# Patient Record
Sex: Female | Born: 1975 | Race: White | Hispanic: No | Marital: Married | Smoking: Former smoker
Health system: Southern US, Community
[De-identification: ages and names within clinical notes are randomized; demographics above are authoritative.]

## PROBLEM LIST (undated history)

## (undated) DIAGNOSIS — Z9289 Personal history of other medical treatment: Secondary | ICD-10-CM

## (undated) DIAGNOSIS — R7989 Other specified abnormal findings of blood chemistry: Secondary | ICD-10-CM

## (undated) DIAGNOSIS — M1990 Unspecified osteoarthritis, unspecified site: Secondary | ICD-10-CM

## (undated) DIAGNOSIS — IMO0001 Reserved for inherently not codable concepts without codable children: Secondary | ICD-10-CM

## (undated) DIAGNOSIS — I219 Acute myocardial infarction, unspecified: Secondary | ICD-10-CM

## (undated) DIAGNOSIS — J449 Chronic obstructive pulmonary disease, unspecified: Secondary | ICD-10-CM

## (undated) DIAGNOSIS — I509 Heart failure, unspecified: Secondary | ICD-10-CM

## (undated) DIAGNOSIS — R011 Cardiac murmur, unspecified: Secondary | ICD-10-CM

## (undated) DIAGNOSIS — E785 Hyperlipidemia, unspecified: Secondary | ICD-10-CM

## (undated) DIAGNOSIS — I639 Cerebral infarction, unspecified: Secondary | ICD-10-CM

## (undated) DIAGNOSIS — G43909 Migraine, unspecified, not intractable, without status migrainosus: Secondary | ICD-10-CM

## (undated) DIAGNOSIS — I10 Essential (primary) hypertension: Secondary | ICD-10-CM

## (undated) HISTORY — DX: Migraine, unspecified, not intractable, without status migrainosus: G43.909

## (undated) HISTORY — DX: Unspecified osteoarthritis, unspecified site: M19.90

## (undated) HISTORY — DX: Other specified abnormal findings of blood chemistry: R79.89

## (undated) HISTORY — DX: Morbid (severe) obesity due to excess calories: E66.01

## (undated) HISTORY — DX: Personal history of other medical treatment: Z92.89

## (undated) HISTORY — DX: Reserved for inherently not codable concepts without codable children: IMO0001

## (undated) HISTORY — DX: Essential (primary) hypertension: I10

## (undated) HISTORY — DX: Hyperlipidemia, unspecified: E78.5

---

## 2001-01-17 DIAGNOSIS — I639 Cerebral infarction, unspecified: Secondary | ICD-10-CM | POA: Insufficient documentation

## 2001-01-17 HISTORY — DX: Cerebral infarction, unspecified: I63.9

## 2003-12-15 ENCOUNTER — Emergency Department: Payer: Self-pay | Admitting: Emergency Medicine

## 2004-05-20 ENCOUNTER — Ambulatory Visit: Payer: Self-pay | Admitting: Unknown Physician Specialty

## 2005-02-03 ENCOUNTER — Ambulatory Visit: Payer: Self-pay | Admitting: Unknown Physician Specialty

## 2006-10-18 ENCOUNTER — Ambulatory Visit: Payer: Self-pay | Admitting: Family Medicine

## 2007-09-28 ENCOUNTER — Ambulatory Visit: Payer: Self-pay | Admitting: Obstetrics & Gynecology

## 2007-10-02 ENCOUNTER — Ambulatory Visit: Payer: Self-pay | Admitting: Obstetrics & Gynecology

## 2007-11-22 ENCOUNTER — Emergency Department: Payer: Self-pay | Admitting: Emergency Medicine

## 2008-03-13 ENCOUNTER — Emergency Department: Payer: Self-pay | Admitting: Emergency Medicine

## 2008-06-26 ENCOUNTER — Ambulatory Visit: Payer: Self-pay | Admitting: Family Medicine

## 2008-08-01 ENCOUNTER — Ambulatory Visit: Payer: Self-pay | Admitting: Family Medicine

## 2008-08-12 ENCOUNTER — Ambulatory Visit: Payer: Self-pay | Admitting: Family Medicine

## 2008-09-01 ENCOUNTER — Ambulatory Visit: Payer: Self-pay | Admitting: Family Medicine

## 2008-09-17 ENCOUNTER — Ambulatory Visit: Payer: Self-pay | Admitting: Family Medicine

## 2010-10-23 ENCOUNTER — Emergency Department: Payer: Self-pay | Admitting: Emergency Medicine

## 2010-11-17 ENCOUNTER — Emergency Department: Payer: Self-pay | Admitting: Emergency Medicine

## 2011-03-19 ENCOUNTER — Emergency Department: Payer: Self-pay | Admitting: *Deleted

## 2011-04-30 ENCOUNTER — Emergency Department: Payer: Self-pay | Admitting: Emergency Medicine

## 2011-04-30 LAB — CBC
HCT: 44.3 % (ref 35.0–47.0)
MCH: 30.7 pg (ref 26.0–34.0)
MCHC: 34.1 g/dL (ref 32.0–36.0)
Platelet: 164 10*3/uL (ref 150–440)
WBC: 7.3 10*3/uL (ref 3.6–11.0)

## 2011-04-30 LAB — URINALYSIS, COMPLETE
Hyaline Cast: 4
Nitrite: POSITIVE
Protein: NEGATIVE
RBC,UR: 4 /HPF (ref 0–5)
Specific Gravity: 1.032 (ref 1.003–1.030)

## 2011-04-30 LAB — COMPREHENSIVE METABOLIC PANEL
Albumin: 3.6 g/dL (ref 3.4–5.0)
BUN: 14 mg/dL (ref 7–18)
Bilirubin,Total: 0.4 mg/dL (ref 0.2–1.0)
Calcium, Total: 8.6 mg/dL (ref 8.5–10.1)
Co2: 25 mmol/L (ref 21–32)
Creatinine: 0.87 mg/dL (ref 0.60–1.30)
Glucose: 90 mg/dL (ref 65–99)
Osmolality: 291 (ref 275–301)
SGPT (ALT): 54 U/L
Sodium: 146 mmol/L — ABNORMAL HIGH (ref 136–145)

## 2011-04-30 LAB — LIPASE, BLOOD: Lipase: 123 U/L (ref 73–393)

## 2011-09-07 ENCOUNTER — Emergency Department: Payer: Self-pay | Admitting: *Deleted

## 2011-09-07 LAB — CBC
HCT: 46.7 % (ref 35.0–47.0)
HGB: 15.9 g/dL (ref 12.0–16.0)
Platelet: 167 10*3/uL (ref 150–440)
WBC: 6.9 10*3/uL (ref 3.6–11.0)

## 2011-09-07 LAB — BASIC METABOLIC PANEL
Anion Gap: 8 (ref 7–16)
BUN: 20 mg/dL — ABNORMAL HIGH (ref 7–18)
Creatinine: 0.86 mg/dL (ref 0.60–1.30)
EGFR (African American): >60
EGFR (Non-African Amer.): >60

## 2011-09-07 LAB — CK TOTAL AND CKMB (NOT AT ARMC): CK-MB: 1.6 ng/mL (ref 0.5–3.6)

## 2011-09-08 LAB — TROPONIN I: Troponin-I: <0.02 ng/mL

## 2011-09-12 ENCOUNTER — Encounter (HOSPITAL_COMMUNITY): Payer: Self-pay | Admitting: Emergency Medicine

## 2011-09-12 ENCOUNTER — Emergency Department (HOSPITAL_COMMUNITY): Payer: Self-pay

## 2011-09-12 ENCOUNTER — Emergency Department (HOSPITAL_COMMUNITY)
Admission: EM | Admit: 2011-09-12 | Discharge: 2011-09-13 | Disposition: A | Discharge status: Home / Self Care | Payer: Self-pay | Attending: Emergency Medicine | Admitting: Emergency Medicine

## 2011-09-12 DIAGNOSIS — J40 Bronchitis, not specified as acute or chronic: Secondary | ICD-10-CM | POA: Insufficient documentation

## 2011-09-12 DIAGNOSIS — R079 Chest pain, unspecified: Secondary | ICD-10-CM | POA: Insufficient documentation

## 2011-09-12 DIAGNOSIS — R0682 Tachypnea, not elsewhere classified: Secondary | ICD-10-CM | POA: Insufficient documentation

## 2011-09-12 DIAGNOSIS — R0602 Shortness of breath: Secondary | ICD-10-CM | POA: Insufficient documentation

## 2011-09-12 HISTORY — DX: Acute myocardial infarction, unspecified: I21.9

## 2011-09-12 HISTORY — DX: Cardiac murmur, unspecified: R01.1

## 2011-09-12 HISTORY — DX: Cerebral infarction, unspecified: I63.9

## 2011-09-12 LAB — COMPREHENSIVE METABOLIC PANEL
ALT: 37 U/L — ABNORMAL HIGH (ref 0–35)
Alkaline Phosphatase: 97 U/L (ref 39–117)
BUN: 24 mg/dL — ABNORMAL HIGH (ref 6–23)
CO2: 17 mEq/L — ABNORMAL LOW (ref 19–32)
Chloride: 109 mEq/L (ref 96–112)
GFR calc Af Amer: >90 mL/min (ref >90)
GFR calc non Af Amer: >90 mL/min (ref >90)
Glucose, Bld: 90 mg/dL (ref 70–99)
Potassium: 3.7 mEq/L (ref 3.5–5.1)
Sodium: 141 mEq/L (ref 135–145)
Total Bilirubin: 0.2 mg/dL — ABNORMAL LOW (ref 0.3–1.2)
Total Protein: 7 g/dL (ref 6.0–8.3)

## 2011-09-12 LAB — CBC WITH DIFFERENTIAL/PLATELET
Basophils Absolute: 0 10*3/uL (ref 0.0–0.1)
Basophils Relative: 0 % (ref 0–1)
Lymphocytes Relative: 34 % (ref 12–46)
MCHC: 34.7 g/dL (ref 30.0–36.0)
Neutro Abs: 4.4 10*3/uL (ref 1.7–7.7)
Neutrophils Relative %: 53 % (ref 43–77)
Platelets: 196 10*3/uL (ref 150–400)
RDW: 14.2 % (ref 11.5–15.5)
WBC: 8.2 10*3/uL (ref 4.0–10.5)

## 2011-09-12 LAB — POCT I-STAT TROPONIN I: Troponin i, poc: 0 ng/mL (ref 0.00–0.08)

## 2011-09-12 MED ORDER — IPRATROPIUM BROMIDE 0.02 % IN SOLN
0.5000 mg | Freq: Once | RESPIRATORY_TRACT | Status: AC
  Administered 2011-09-13: 0.5 mg via RESPIRATORY_TRACT
  Filled 2011-09-12: qty 2.5

## 2011-09-12 MED ORDER — ALBUTEROL SULFATE (5 MG/ML) 0.5% IN NEBU
5.0000 mg | INHALATION_SOLUTION | Freq: Once | RESPIRATORY_TRACT | Status: AC
  Administered 2011-09-13: 5 mg via RESPIRATORY_TRACT
  Filled 2011-09-12: qty 1

## 2011-09-12 NOTE — ED Notes (Signed)
Pt reports mid-sternum chest pain/pressure, bilateral hand numbness, SOB that increases w/exertion x6 days, pt describes her pain as a stabbing pain. Pt states "it feels like someone is stabbing me w/a knife and rotating it." pt's sats 100% ra. Pt denies dizziness, N/V/D, or abd/back pain. Pt sen at [facility] ED for the same on Wednesday and dx a "heart murmur."

## 2011-09-12 NOTE — ED Notes (Addendum)
SOB; chest pain starting Wednesday; stabbing and radiating down left arm; worse with inspiration; worse with lying down flat. Feels lightheaded and numbness and fingers when walking a lot. Breathe sounds clear.

## 2011-09-12 NOTE — ED Notes (Signed)
MD at bedside. EDPA Peter 

## 2011-09-12 NOTE — ED Provider Notes (Signed)
History     CSN: [id number]  Arrival date & time 09/12/11  2051   First MD Initiated Contact with Patient 09/12/11 2341      Chief Complaint  Patient presents with  . Chest Pain   HPI  History provided by the patient. Patient is a 36 year old female who reports a history of stroke in 2003 as well as a history of MI in 2008 who presents with complaints of intermittent chest pain and discomfort with shortness of breath for the past several days. Patient states that symptoms began last Wednesday 5 days ago. She reports having shortness of breath symptoms with chest pressure and sharp pains. Pain is worse with activity or with lying flat. Symptoms improve sitting upright and resting comfortably. Patient also reports sharp pleuritic pains with deep breathing or cough. slightly increased nonproductive cough. Denies hemoptysis. Shortness of breath symptoms also produce increased breathing and perioral numbness as well as numbness and seemed to fingers. Patient reports that she went to [facility] for these symptoms on Wednesday during that workup she states she was told she had a heart murmur but states she became upset with the staff in left. She does state today did some tests including an x-ray but she was not told of having any concerning findings. Patient denies having similar symptoms previously. She denies any recent travel, no prior history of DVT or PE, no history of cancer, no history of admission birth control use.     Past Medical History  Diagnosis Date  . Stroke   . MI (myocardial infarction)     2008  . Murmur     No past surgical history on file.  No family history on file.  History  Substance Use Topics  . Smoking status: Current Everyday Smoker -- 1.0 packs/day  . Smokeless tobacco: Not on file  . Alcohol Use:     OB History    Grav Para Term Preterm Abortions TAB SAB Ect Mult Living                  Review of Systems  Constitutional: Negative for fever  and chills.  Respiratory: Positive for cough and shortness of breath.   Cardiovascular: Positive for chest pain. Negative for palpitations.  Gastrointestinal: Negative for nausea, vomiting, diarrhea and constipation.  Genitourinary: Positive for decreased urine volume. Negative for dysuria, frequency, hematuria and flank pain.  Skin: Negative for rash.  Neurological: Positive for light-headedness and numbness. Negative for weakness.    Allergies  Aspirin and Penicillins  Home Medications  No current outpatient prescriptions on file.  BP 105/67  Pulse 86  Temp 97.8 F (36.6 C) (Oral)  Resp 27  SpO2 100%  Physical Exam  Nursing note and vitals reviewed. Constitutional: She is oriented to person, place, and time. She appears well-developed and well-nourished. No distress.  HENT:  Head: Normocephalic and atraumatic.  Cardiovascular: Normal rate and regular rhythm.   No murmur heard. Pulmonary/Chest: Breath sounds normal. Tachypnea noted. No respiratory distress. She has no wheezes. She has no rales. She exhibits no tenderness.  Abdominal: Soft. There is no tenderness. There is no rebound and no guarding.  Musculoskeletal: Normal range of motion. She exhibits no edema and no tenderness.       No significant swelling of lower extremities. No tenderness of her calves. Skin normal color and temperature.  Neurological: She is alert and oriented to person, place, and time.  Skin: Skin is warm and dry. No rash noted.  Psychiatric:  She has a normal mood and affect. Her behavior is normal.    ED Course  Procedures    Results for orders placed during the hospital encounter of 09/12/11  COMPREHENSIVE METABOLIC PANEL      Component Value Range   Sodium 141  135 - 145 mEq/L   Potassium 3.7  3.5 - 5.1 mEq/L   Chloride 109  96 - 112 mEq/L   CO2 17 (*) 19 - 32 mEq/L   Glucose, Bld 90  70 - 99 mg/dL   BUN 24 (*) 6 - 23 mg/dL   Creatinine, Ser 1.61  0.50 - 1.10 mg/dL   Calcium 9.6  8.4  - 09.6 mg/dL   Total Protein 7.0  6.0 - 8.3 g/dL   Albumin 3.8  3.5 - 5.2 g/dL   AST 29  0 - 37 U/L   ALT 37 (*) 0 - 35 U/L   Alkaline Phosphatase 97  39 - 117 U/L   Total Bilirubin 0.2 (*) 0.3 - 1.2 mg/dL   GFR calc non Af Amer >90  >90 mL/min   GFR calc Af Amer >90  >90 mL/min  CBC WITH DIFFERENTIAL      Component Value Range   WBC 8.2  4.0 - 10.5 K/uL   RBC 5.04  3.87 - 5.11 MIL/uL   Hemoglobin 15.3 (*) 12.0 - 15.0 g/dL   HCT 04.5  40.9 - 81.1 %   MCV 87.5  78.0 - 100.0 fL   MCH 30.4  26.0 - 34.0 pg   MCHC 34.7  30.0 - 36.0 g/dL   RDW 91.4  78.2 - 95.6 %   Platelets 196  150 - 400 K/uL   Neutrophils Relative 53  43 - 77 %   Neutro Abs 4.4  1.7 - 7.7 K/uL   Lymphocytes Relative 34  12 - 46 %   Lymphs Abs 2.8  0.7 - 4.0 K/uL   Monocytes Relative 10  3 - 12 %   Monocytes Absolute 0.8  0.1 - 1.0 K/uL   Eosinophils Relative 2  0 - 5 %   Eosinophils Absolute 0.2  0.0 - 0.7 K/uL   Basophils Relative 0  0 - 1 %   Basophils Absolute 0.0  0.0 - 0.1 K/uL  POCT I-STAT TROPONIN I      Component Value Range   Troponin i, poc 0.00  0.00 - 0.08 ng/mL   Comment 3           D-DIMER, QUANTITATIVE      Component Value Range   D-Dimer, Quant 0.34  0.00 - 0.48 ug/mL-FEU       Dg Chest 2 View  09/12/2011  *RADIOLOGY REPORT*  Clinical Data: Chest pain and shortness of breath.  CHEST - 2 VIEW  Comparison: None.  Findings: Normal sized heart.  Clear lungs.  Breathing motion blurring on the lateral view.  Minimal diffuse peribronchial thickening.  Mild thoracic spine degenerative changes.  IMPRESSION: Minimal bronchitic changes.   Original Report Authenticated By: Darrol Angel, M.D.      1. Bronchitis       MDM  11:40PM issues seen and evaluated. Patient does not appear in any acute distress does have slight tachypnea. Normal O2 sats, normal heart rate.  Patient has not had any recent travel, no recent surgery, no prior history of cancer, no prior history of DVT or PE, no history of  hemoptysis, no history of estrogen use. D-dimer normal.  Patient with symptoms of hyperventilation  perioral numbness tingling and numbness in fingers and lightheadedness.  Patient given breathing treatment and reports feeling much better. Patient now with normal respirations. She continues abnormal O2 sats and heart rate. Symptoms may be related to bronchitis that an x-ray and recent increase cough. Patient given albuterol inhaler to take home. She given strict return precautions.      Date: 09/13/2011  Rate: 96  Rhythm: normal sinus rhythm  QRS Axis: left  Intervals: normal  ST/T Wave abnormalities: normal  Conduction Disutrbances:none  Narrative Interpretation: Slight left axis deviation  Old EKG Reviewed: unchanged    Angus Seller, PA 09/13/11 0107

## 2011-09-12 NOTE — ED Notes (Signed)
Patient ambulatory without difficulties outside.  Patient states "I need to get some air."

## 2011-09-13 ENCOUNTER — Encounter (HOSPITAL_COMMUNITY): Payer: Self-pay

## 2011-09-13 LAB — D-DIMER, QUANTITATIVE: D-Dimer, Quant: 0.34 ug/mL-FEU (ref 0.00–0.48)

## 2011-09-13 MED ORDER — ALBUTEROL SULFATE HFA 108 (90 BASE) MCG/ACT IN AERS
2.0000 | INHALATION_SPRAY | RESPIRATORY_TRACT | Status: DC | PRN
  Administered 2011-09-13: 2 via RESPIRATORY_TRACT
  Filled 2011-09-13: qty 6.7

## 2011-09-13 NOTE — ED Provider Notes (Signed)
Medical screening examination/treatment/procedure(s) were performed by non-physician practitioner and as supervising physician I was immediately available for consultation/collaboration.  Sunnie Nielsen, MD 09/13/11 [phone]

## 2012-07-08 ENCOUNTER — Emergency Department: Payer: Self-pay | Admitting: Emergency Medicine

## 2012-07-08 LAB — URINALYSIS, COMPLETE
Bacteria: NONE SEEN
Bilirubin,UR: NEGATIVE
Glucose,UR: NEGATIVE mg/dL (ref 0–75)
Ketone: NEGATIVE
Ph: 5 (ref 4.5–8.0)
Protein: NEGATIVE
WBC UR: 3 /HPF (ref 0–5)

## 2012-07-08 LAB — BASIC METABOLIC PANEL
BUN: 17 mg/dL (ref 7–18)
Calcium, Total: 9.2 mg/dL (ref 8.5–10.1)
Chloride: 108 mmol/L — ABNORMAL HIGH (ref 98–107)
Co2: 27 mmol/L (ref 21–32)
Creatinine: 0.95 mg/dL (ref 0.60–1.30)
Glucose: 112 mg/dL — ABNORMAL HIGH (ref 65–99)
Potassium: 3.7 mmol/L (ref 3.5–5.1)

## 2012-07-08 LAB — CBC
MCH: 30.5 pg (ref 26.0–34.0)
MCHC: 34.1 g/dL (ref 32.0–36.0)
RDW: 14.3 % (ref 11.5–14.5)

## 2012-07-23 ENCOUNTER — Emergency Department: Payer: Self-pay | Admitting: Emergency Medicine

## 2012-09-24 ENCOUNTER — Emergency Department: Payer: Self-pay | Admitting: Emergency Medicine

## 2012-10-12 ENCOUNTER — Emergency Department: Payer: Self-pay | Admitting: Emergency Medicine

## 2012-10-12 LAB — CBC
MCH: 30.5 pg (ref 26.0–34.0)
MCHC: 34.3 g/dL (ref 32.0–36.0)
MCV: 89 fL (ref 80–100)
Platelet: 166 10*3/uL (ref 150–440)
RDW: 14.5 % (ref 11.5–14.5)
WBC: 6.4 10*3/uL (ref 3.6–11.0)

## 2012-10-13 LAB — COMPREHENSIVE METABOLIC PANEL
Albumin: 3.3 g/dL — ABNORMAL LOW (ref 3.4–5.0)
Anion Gap: 6 — ABNORMAL LOW (ref 7–16)
Co2: 23 mmol/L (ref 21–32)
Creatinine: 0.88 mg/dL (ref 0.60–1.30)
EGFR (African American): >60
Glucose: 150 mg/dL — ABNORMAL HIGH (ref 65–99)
Osmolality: 286 (ref 275–301)
Potassium: 3.6 mmol/L (ref 3.5–5.1)
SGOT(AST): 25 U/L (ref 15–37)
Sodium: 142 mmol/L (ref 136–145)

## 2012-12-05 ENCOUNTER — Emergency Department: Payer: Self-pay | Admitting: Internal Medicine

## 2012-12-20 ENCOUNTER — Emergency Department: Payer: Self-pay | Admitting: Internal Medicine

## 2012-12-20 LAB — URINALYSIS, COMPLETE
Ph: 5 (ref 4.5–8.0)
RBC,UR: 5 /HPF (ref 0–5)

## 2012-12-22 LAB — URINE CULTURE

## 2013-03-08 ENCOUNTER — Emergency Department: Payer: Self-pay | Admitting: Emergency Medicine

## 2013-03-21 ENCOUNTER — Emergency Department: Payer: Self-pay | Admitting: Emergency Medicine

## 2013-03-21 LAB — TROPONIN I

## 2013-03-21 LAB — URINALYSIS, COMPLETE
BILIRUBIN, UR: NEGATIVE
Glucose,UR: NEGATIVE mg/dL (ref 0–75)
Ketone: NEGATIVE
NITRITE: POSITIVE
Ph: 5 (ref 4.5–8.0)
Protein: NEGATIVE
SPECIFIC GRAVITY: 1.027 (ref 1.003–1.030)
Squamous Epithelial: 6
WBC UR: 21 /HPF (ref 0–5)

## 2013-03-21 LAB — CBC
HCT: 44.9 % (ref 35.0–47.0)
HGB: 14.7 g/dL (ref 12.0–16.0)
MCH: 29.5 pg (ref 26.0–34.0)
MCHC: 32.7 g/dL (ref 32.0–36.0)
MCV: 90 fL (ref 80–100)
Platelet: 190 10*3/uL (ref 150–440)
RBC: 4.98 10*6/uL (ref 3.80–5.20)
RDW: 14.5 % (ref 11.5–14.5)
WBC: 6.5 10*3/uL (ref 3.6–11.0)

## 2013-03-21 LAB — BASIC METABOLIC PANEL
Anion Gap: 6 — ABNORMAL LOW (ref 7–16)
BUN: 12 mg/dL (ref 7–18)
CHLORIDE: 110 mmol/L — AB (ref 98–107)
Calcium, Total: 9 mg/dL (ref 8.5–10.1)
Co2: 25 mmol/L (ref 21–32)
Creatinine: 0.87 mg/dL (ref 0.60–1.30)
EGFR (African American): >60
EGFR (Non-African Amer.): >60
GLUCOSE: 77 mg/dL (ref 65–99)
OSMOLALITY: 280 (ref 275–301)
Potassium: 3.4 mmol/L — ABNORMAL LOW (ref 3.5–5.1)
Sodium: 141 mmol/L (ref 136–145)

## 2013-03-21 LAB — PRO B NATRIURETIC PEPTIDE: B-TYPE NATIURETIC PEPTID: 91 pg/mL (ref 0–125)

## 2013-04-04 ENCOUNTER — Emergency Department: Payer: Self-pay | Admitting: Emergency Medicine

## 2013-07-24 ENCOUNTER — Emergency Department: Payer: Self-pay | Admitting: Emergency Medicine

## 2013-07-24 LAB — CBC
HCT: 50.2 % — ABNORMAL HIGH (ref 35.0–47.0)
HGB: 16 g/dL (ref 12.0–16.0)
MCH: 29 pg (ref 26.0–34.0)
MCHC: 31.8 g/dL — AB (ref 32.0–36.0)
MCV: 91 fL (ref 80–100)
PLATELETS: 186 10*3/uL (ref 150–440)
RBC: 5.52 10*6/uL — AB (ref 3.80–5.20)
RDW: 14.7 % — AB (ref 11.5–14.5)
WBC: 6.6 10*3/uL (ref 3.6–11.0)

## 2013-07-24 LAB — URINALYSIS, COMPLETE
BILIRUBIN, UR: NEGATIVE
GLUCOSE, UR: NEGATIVE mg/dL (ref 0–75)
KETONE: NEGATIVE
NITRITE: POSITIVE
Ph: 6 (ref 4.5–8.0)
SPECIFIC GRAVITY: 1.027 (ref 1.003–1.030)
Squamous Epithelial: 105
WBC UR: 59 /HPF (ref 0–5)

## 2013-07-24 LAB — COMPREHENSIVE METABOLIC PANEL
ALBUMIN: 3.8 g/dL (ref 3.4–5.0)
ALK PHOS: 77 U/L
ALT: 47 U/L (ref 12–78)
ANION GAP: 7 (ref 7–16)
BILIRUBIN TOTAL: 0.4 mg/dL (ref 0.2–1.0)
BUN: 14 mg/dL (ref 7–18)
CREATININE: 0.64 mg/dL (ref 0.60–1.30)
Calcium, Total: 8.7 mg/dL (ref 8.5–10.1)
Chloride: 109 mmol/L — ABNORMAL HIGH (ref 98–107)
Co2: 27 mmol/L (ref 21–32)
GLUCOSE: 88 mg/dL (ref 65–99)
OSMOLALITY: 285 (ref 275–301)
POTASSIUM: 3.7 mmol/L (ref 3.5–5.1)
SGOT(AST): 33 U/L (ref 15–37)
Sodium: 143 mmol/L (ref 136–145)
Total Protein: 7.5 g/dL (ref 6.4–8.2)

## 2013-07-24 LAB — TROPONIN I: Troponin-I: <0.02 ng/mL

## 2013-07-24 LAB — LIPASE, BLOOD: LIPASE: 175 U/L (ref 73–393)

## 2014-01-30 ENCOUNTER — Emergency Department: Payer: Self-pay | Admitting: Emergency Medicine

## 2014-02-05 DIAGNOSIS — M5417 Radiculopathy, lumbosacral region: Secondary | ICD-10-CM | POA: Insufficient documentation

## 2014-02-05 DIAGNOSIS — IMO0002 Reserved for concepts with insufficient information to code with codable children: Secondary | ICD-10-CM | POA: Insufficient documentation

## 2014-02-13 ENCOUNTER — Ambulatory Visit: Payer: Self-pay | Admitting: Surgery

## 2014-03-10 ENCOUNTER — Emergency Department: Payer: Self-pay | Admitting: Emergency Medicine

## 2014-03-26 ENCOUNTER — Ambulatory Visit: Payer: Self-pay | Admitting: Family Medicine

## 2014-04-04 ENCOUNTER — Ambulatory Visit: Payer: Self-pay | Admitting: Family Medicine

## 2014-12-08 ENCOUNTER — Emergency Department: Payer: 59

## 2014-12-08 ENCOUNTER — Encounter: Payer: Self-pay | Admitting: Medical Oncology

## 2014-12-08 ENCOUNTER — Emergency Department
Admission: EM | Admit: 2014-12-08 | Discharge: 2014-12-08 | Disposition: A | Discharge status: Home / Self Care | Payer: 59 | Attending: Emergency Medicine | Admitting: Emergency Medicine

## 2014-12-08 DIAGNOSIS — F17200 Nicotine dependence, unspecified, uncomplicated: Secondary | ICD-10-CM | POA: Diagnosis not present

## 2014-12-08 DIAGNOSIS — R109 Unspecified abdominal pain: Secondary | ICD-10-CM | POA: Diagnosis present

## 2014-12-08 DIAGNOSIS — R319 Hematuria, unspecified: Secondary | ICD-10-CM | POA: Diagnosis not present

## 2014-12-08 DIAGNOSIS — Z880 Allergy status to penicillin: Secondary | ICD-10-CM | POA: Diagnosis not present

## 2014-12-08 LAB — BASIC METABOLIC PANEL
Anion gap: 7 (ref 5–15)
BUN: 13 mg/dL (ref 6–20)
CHLORIDE: 105 mmol/L (ref 101–111)
CO2: 23 mmol/L (ref 22–32)
Calcium: 8.7 mg/dL — ABNORMAL LOW (ref 8.9–10.3)
Creatinine, Ser: 0.81 mg/dL (ref 0.44–1.00)
GFR calc non Af Amer: >60 mL/min (ref >60)
GLUCOSE: 95 mg/dL (ref 65–99)
POTASSIUM: 3.3 mmol/L — AB (ref 3.5–5.1)
Sodium: 135 mmol/L (ref 135–145)

## 2014-12-08 LAB — URINALYSIS COMPLETE WITH MICROSCOPIC (ARMC ONLY)
Bilirubin Urine: NEGATIVE
Glucose, UA: NEGATIVE mg/dL
Ketones, ur: NEGATIVE mg/dL
Leukocytes, UA: NEGATIVE
Nitrite: NEGATIVE
PROTEIN: NEGATIVE mg/dL
SPECIFIC GRAVITY, URINE: 1.018 (ref 1.005–1.030)
pH: 5 (ref 5.0–8.0)

## 2014-12-08 LAB — CBC
HEMATOCRIT: 48 % — AB (ref 35.0–47.0)
HEMOGLOBIN: 15.9 g/dL (ref 12.0–16.0)
MCH: 29.5 pg (ref 26.0–34.0)
MCHC: 33.1 g/dL (ref 32.0–36.0)
MCV: 89.2 fL (ref 80.0–100.0)
Platelets: 177 10*3/uL (ref 150–440)
RBC: 5.39 MIL/uL — ABNORMAL HIGH (ref 3.80–5.20)
RDW: 14.5 % (ref 11.5–14.5)
WBC: 8.2 10*3/uL (ref 3.6–11.0)

## 2014-12-08 MED ORDER — PHENAZOPYRIDINE HCL 200 MG PO TABS: 200.0000 mg | ORAL_TABLET | Freq: Three times a day (TID) | ORAL | Status: AC | PRN

## 2014-12-08 NOTE — Discharge Instructions (Signed)
Please seek medical attention for any high fevers, chest pain, shortness of breath, change in behavior, persistent vomiting, bloody stool or any other new or concerning symptoms. ° ° °Flank Pain °Flank pain refers to pain that is located on the side of the body between the upper abdomen and the back. The pain may occur over a short period of time (acute) or may be long-term or reoccurring (chronic). It may be mild or severe. Flank pain can be caused by many things. °CAUSES  °Some of the more common causes of flank pain include: °· Muscle strains.   °· Muscle spasms.   °· A disease of your spine (vertebral disk disease).   °· A lung infection (pneumonia).   °· Fluid around your lungs (pulmonary edema).   °· A kidney infection.   °· Kidney stones.   °· A very painful skin rash caused by the chickenpox virus (shingles).   °· Gallbladder disease.   °HOME CARE INSTRUCTIONS  °Home care will depend on the cause of your pain. In general, °· Rest as directed by your caregiver. °· Drink enough fluids to keep your urine clear or pale yellow. °· Only take over-the-counter or prescription medicines as directed by your caregiver. Some medicines may help relieve the pain. °· Tell your caregiver about any changes in your pain. °· Follow up with your caregiver as directed. °SEEK IMMEDIATE MEDICAL CARE IF:  °· Your pain is not controlled with medicine.   °· You have new or worsening symptoms. °· Your pain increases.   °· You have abdominal pain.   °· You have shortness of breath.   °· You have persistent nausea or vomiting.   °· You have swelling in your abdomen.   °· You feel faint or pass out.   °· You have blood in your urine. °· You have a fever or persistent symptoms for more than 2-3 days. °· You have a fever and your symptoms suddenly get worse. °MAKE SURE YOU:  °· Understand these instructions. °· Will watch your condition. °· Will get help right away if you are not doing well or get worse. °  °This information is not  intended to replace advice given to you by your health care provider. Make sure you discuss any questions you have with your health care provider. °  °Document Released: 02/24/2005 Document Revised: 09/28/2011 Document Reviewed: 08/18/2011 °Elsevier Interactive Patient Education ©2016 Elsevier Inc. ° °

## 2014-12-08 NOTE — ED Notes (Signed)
Pt reports bilt flank pain and blood in her urine since 0300 this am.

## 2014-12-08 NOTE — ED Notes (Signed)
Pt reports that she has had many urinary tract infections since she had her bladder "tacked" a few years ago. She states that this feels different and that her pain is higher in her flank area, mostly on the right side. Pt denies urinary frequency, burning upon urination, fevers/chills. Pt states that it is difficult to stand straight up due to the pain. Pt alert & oriented with warm, dry skin. NAD noted.

## 2014-12-08 NOTE — ED Provider Notes (Signed)
[facility] Emergency Department Provider Note   ____________________________________________  Time seen: 1750  I have reviewed the triage vital signs and the nursing notes.   HISTORY  Chief Complaint Hematuria and Flank Pain   History limited by: Not Limited   HPI Kelly Horton is a 39 y.o. female who presents to the emergency department today with concerns for right flank pain. She states that this pain started roughly 15 hours ago. She states it has been constant. It is located in the right flank. She has had some pain in the left flank as well. She states that she did notice some associated hematuria earlier today. She states she has a history of frequent urinary tract infections. She denies any kidney stones all her states there is a family history of stones. She denies any fevers. Denies any nausea or vomiting.   Past Medical History  Diagnosis Date  . Stroke (HCC)   . MI (myocardial infarction) (HCC)     2008  . Murmur     There are no active problems to display for this patient.   No past surgical history on file.  Current Outpatient Rx  Name  Route  Sig  Dispense  Refill  . acetaminophen (TYLENOL) 500 MG tablet   Oral   Take 1,500 mg by mouth every 6 (six) hours as needed. For headaches         . ibuprofen (ADVIL,MOTRIN) 200 MG tablet   Oral   Take 600 mg by mouth every 6 (six) hours as needed. For headaches           Allergies Aspirin and Penicillins  No family history on file.  Social History Social History  Substance Use Topics  . Smoking status: Current Every Day Smoker -- 1.00 packs/day  . Smokeless tobacco: None  . Alcohol Use: None    Review of Systems  Constitutional: Negative for fever. Cardiovascular: Negative for chest pain. Respiratory: Negative for shortness of breath. Gastrointestinal: Positive for right flank pain Genitourinary: Negative for dysuria. Musculoskeletal: Negative for back  pain. Skin: Negative for rash. Neurological: Negative for headaches, focal weakness or numbness.   10-point ROS otherwise negative.  ____________________________________________   PHYSICAL EXAM:  VITAL SIGNS: ED Triage Vitals  Enc Vitals Group     BP 12/08/14 1623 131/89 mmHg     Pulse Rate 12/08/14 1623 86     Resp 12/08/14 1623 18     Temp 12/08/14 1623 98.3 F (36.8 C)     Temp Source 12/08/14 1623 Oral     SpO2 12/08/14 1623 98 %     Weight 12/08/14 1623 210 lb (95.255 kg)     Height 12/08/14 1623  (1.575 m)     Head Cir --      Peak Flow --      Pain Score 12/08/14 1624 4   Constitutional: Alert and oriented. Well appearing and in no distress. Eyes: Conjunctivae are normal. PERRL. Normal extraocular movements. ENT   Head: Normocephalic and atraumatic.   Nose: No congestion/rhinnorhea.   Mouth/Throat: Mucous membranes are moist.   Neck: No stridor. Hematological/Lymphatic/Immunilogical: No cervical lymphadenopathy. Cardiovascular: Normal rate, regular rhythm.  No murmurs, rubs, or gallops. Respiratory: Normal respiratory effort without tachypnea nor retractions. Breath sounds are clear and equal bilaterally. No wheezes/rales/rhonchi. Gastrointestinal: Soft and nontender. No distention. Mild right-sided CVA tenderness. Genitourinary: Deferred Musculoskeletal: Normal range of motion in all extremities. No joint effusions.  No lower extremity tenderness nor edema. Neurologic:  Normal speech and language. No gross focal neurologic deficits are appreciated.  Skin:  Skin is warm, dry and intact. No rash noted. Psychiatric: Mood and affect are normal. Speech and behavior are normal. Patient exhibits appropriate insight and judgment.  ____________________________________________    LABS (pertinent positives/negatives)  Labs Reviewed  BASIC METABOLIC PANEL - Abnormal; Notable for the following:    Potassium 3.3 (*)    Calcium 8.7 (*)    All other  components within normal limits  CBC - Abnormal; Notable for the following:    RBC 5.39 (*)    HCT 48.0 (*)    All other components within normal limits  URINALYSIS COMPLETEWITH MICROSCOPIC ([facility] ONLY) - Abnormal; Notable for the following:    Color, Urine YELLOW (*)    APPearance CLOUDY (*)    Hgb urine dipstick 1+ (*)    Bacteria, UA RARE (*)    Squamous Epithelial / LPF 6-30 (*)    All other components within normal limits    ____________________________________________   EKG  None  ____________________________________________    RADIOLOGY  Renal ultrasound  IMPRESSION: Unremarkable sonographic appearance of the kidneys.  ____________________________________________   PROCEDURES  Procedure(s) performed: None  Critical Care performed: No  ____________________________________________   INITIAL IMPRESSION / ASSESSMENT AND PLAN / ED COURSE  Pertinent labs & imaging results that were available during my care of the patient were reviewed by me and considered in my medical decision making (see chart for details).  Patient presents to the emergency department today because of concerns for right flank pain and hematuria. On my exam patient very mild CVA tenderness. No abdominal tenderness. Blood work without any leukocytosis. Urine without any red blood cells or white blood cells. I did get a renal ultrasound which was normal. This point unclear etiology. We will send urine for culture. Will send home with Pyridium.  ____________________________________________   FINAL CLINICAL IMPRESSION(S) / ED DIAGNOSES  Final diagnoses:  Flank pain     Phineas SemenGraydon Brenisha Tsui, MD 12/08/14 1942

## 2014-12-11 LAB — URINE CULTURE: Culture: >=100000

## 2015-03-26 DIAGNOSIS — Z5329 Procedure and treatment not carried out because of patient's decision for other reasons: Secondary | ICD-10-CM | POA: Insufficient documentation

## 2015-03-29 ENCOUNTER — Encounter: Payer: Self-pay | Admitting: Emergency Medicine

## 2015-03-29 ENCOUNTER — Emergency Department
Admission: EM | Admit: 2015-03-29 | Discharge: 2015-03-29 | Disposition: A | Discharge status: Home / Self Care | Payer: 59 | Attending: Emergency Medicine | Admitting: Emergency Medicine

## 2015-03-29 DIAGNOSIS — F17200 Nicotine dependence, unspecified, uncomplicated: Secondary | ICD-10-CM | POA: Insufficient documentation

## 2015-03-29 DIAGNOSIS — Z880 Allergy status to penicillin: Secondary | ICD-10-CM | POA: Diagnosis not present

## 2015-03-29 DIAGNOSIS — X58XXXA Exposure to other specified factors, initial encounter: Secondary | ICD-10-CM | POA: Diagnosis not present

## 2015-03-29 DIAGNOSIS — M545 Low back pain, unspecified: Secondary | ICD-10-CM

## 2015-03-29 DIAGNOSIS — S39012A Strain of muscle, fascia and tendon of lower back, initial encounter: Secondary | ICD-10-CM | POA: Insufficient documentation

## 2015-03-29 DIAGNOSIS — S3992XA Unspecified injury of lower back, initial encounter: Secondary | ICD-10-CM | POA: Diagnosis present

## 2015-03-29 DIAGNOSIS — Y998 Other external cause status: Secondary | ICD-10-CM | POA: Diagnosis not present

## 2015-03-29 DIAGNOSIS — Y9289 Other specified places as the place of occurrence of the external cause: Secondary | ICD-10-CM | POA: Insufficient documentation

## 2015-03-29 DIAGNOSIS — Y9389 Activity, other specified: Secondary | ICD-10-CM | POA: Diagnosis not present

## 2015-03-29 MED ORDER — KETOROLAC TROMETHAMINE 60 MG/2ML IM SOLN
INTRAMUSCULAR | Status: AC
  Administered 2015-03-29: 30 mg
  Filled 2015-03-29: qty 2

## 2015-03-29 MED ORDER — CYCLOBENZAPRINE HCL 5 MG PO TABS: 5.0000 mg | ORAL_TABLET | Freq: Three times a day (TID) | ORAL | Status: DC | PRN

## 2015-03-29 MED ORDER — HYDROCODONE-ACETAMINOPHEN 5-325 MG PO TABS: 1.0000 | ORAL_TABLET | Freq: Four times a day (QID) | ORAL | Status: DC | PRN

## 2015-03-29 MED ORDER — DIAZEPAM 5 MG PO TABS
5.0000 mg | ORAL_TABLET | Freq: Once | ORAL | Status: AC
  Administered 2015-03-29: 5 mg via ORAL
  Filled 2015-03-29: qty 1

## 2015-03-29 MED ORDER — KETOROLAC TROMETHAMINE 30 MG/ML IJ SOLN: 30.0000 mg | Freq: Once | INTRAMUSCULAR | Status: DC

## 2015-03-29 MED ORDER — KETOROLAC TROMETHAMINE 10 MG PO TABS: 10.0000 mg | ORAL_TABLET | Freq: Three times a day (TID) | ORAL | Status: DC

## 2015-03-29 NOTE — ED Notes (Signed)
Reports lower back pain x 1 wk, states today pain is worse.

## 2015-03-29 NOTE — ED Notes (Addendum)
Pt states she felt a pop in her back last night with a burning sensation and family reported golf ball sized bulge in middle of her back. No deformity noted to back at this time. Pt states she did not come in last night because there was no parking. Pt is tearful and restless in room, son with her.

## 2015-03-29 NOTE — Discharge Instructions (Signed)
Lumbosacral Strain Lumbosacral strain is a strain of any of the parts that make up your lumbosacral vertebrae. Your lumbosacral vertebrae are the bones that make up the lower third of your backbone. Your lumbosacral vertebrae are held together by muscles and tough, fibrous tissue (ligaments).  CAUSES  A sudden blow to your back can cause lumbosacral strain. Also, anything that causes an excessive stretch of the muscles in the low back can cause this strain. This is typically seen when people exert themselves strenuously, fall, lift heavy objects, bend, or crouch repeatedly. RISK FACTORS  Physically demanding work.  Participation in pushing or pulling sports or sports that require a sudden twist of the back (tennis, golf, baseball).  Weight lifting.  Excessive lower back curvature.  Forward-tilted pelvis.  Weak back or abdominal muscles or both.  Tight hamstrings. SIGNS AND SYMPTOMS  Lumbosacral strain may cause pain in the area of your injury or pain that moves (radiates) down your leg.  DIAGNOSIS Your health care provider can often diagnose lumbosacral strain through a physical exam. In some cases, you may need tests such as X-ray exams.  TREATMENT  Treatment for your lower back injury depends on many factors that your clinician will have to evaluate. However, most treatment will include the use of anti-inflammatory medicines. HOME CARE INSTRUCTIONS   Avoid hard physical activities (tennis, racquetball, waterskiing) if you are not in proper physical condition for it. This may aggravate or create problems.  If you have a back problem, avoid sports requiring sudden body movements. Swimming and walking are generally safer activities.  Maintain good posture.  Maintain a healthy weight.  For acute conditions, you may put ice on the injured area.  Put ice in a plastic bag.  Place a towel between your skin and the bag.  Leave the ice on for 20 minutes, 2-3 times a day.  When the  low back starts healing, stretching and strengthening exercises may be recommended. SEEK MEDICAL CARE IF:  Your back pain is getting worse.  You experience severe back pain not relieved with medicines. SEEK IMMEDIATE MEDICAL CARE IF:   You have numbness, tingling, weakness, or problems with the use of your arms or legs.  There is a change in bowel or bladder control.  You have increasing pain in any area of the body, including your belly (abdomen).  You notice shortness of breath, dizziness, or feel faint.  You feel sick to your stomach (nauseous), are throwing up (vomiting), or become sweaty.  You notice discoloration of your toes or legs, or your feet get very cold. MAKE SURE YOU:   Understand these instructions.  Will watch your condition.  Will get help right away if you are not doing well or get worse.   This information is not intended to replace advice given to you by your health care provider. Make sure you discuss any questions you have with your health care provider.   Document Released: 10/13/2004 Document Revised: 01/24/2014 Document Reviewed: 08/22/2012 Elsevier Interactive Patient Education [employer]2016 Elsevier Inc.   Your exam was consistent with a strain to the lower back. You should take the prescription meds as directed. Apply ice to the muscles to reduce symptoms. Follow-up with your provider or Dr. Joice LoftsPoggi for continued symptoms.

## 2015-03-29 NOTE — ED Notes (Signed)
Discussed discharge instructions, prescriptions, and follow-up care with patient. No questions or concerns at this time. Pt stable at discharge.  

## 2015-03-29 NOTE — ED Provider Notes (Signed)
[facility] Emergency Department Provider Note ____________________________________________  Time seen: 1112  I have reviewed the triage vital signs and the nursing notes.  HISTORY  Chief Complaint  Back Pain  HPI Kelly Horton is a 40 y.o. female presents to the ED for evaluation of pain to the low back after she describes feeling a "pop" when she tried to climb into her truck last week. She describes since that time she's had increasing pain and stiffness in her bilateral low back and into the upper buttocks and thighs. She denies any referral of pain beyond the knees. She does give a history of sciatica on the left, previously evaluated by Dr. Joice LoftsPoggi. She denies any current or intermittent symptoms related to that. She does endorse a single episode of both bladder and bowel incontinence yesterday. She also advises she's been able to work but noted some increased difficulty with standing. She is dosed Tylenol primarily for pain relief. She also endorses taking a half a tablet of Flexeril last night and a full tablet this morning. She rates her pain in her low back at a 10/10 in triage. She describes the pain as constant, and burning in nature.  Past Medical History  Diagnosis Date  . Stroke (HCC)   . MI (myocardial infarction) (HCC)     2008  . Murmur    There are no active problems to display for this patient.  History reviewed. No pertinent past surgical history.  Current Outpatient Rx  Name  Route  Sig  Dispense  Refill  . acetaminophen (TYLENOL) 500 MG tablet   Oral   Take 1,500 mg by mouth every 6 (six) hours as needed. For headaches         . cyclobenzaprine (FLEXERIL) 5 MG tablet   Oral   Take 1 tablet (5 mg total) by mouth 3 (three) times daily as needed for muscle spasms.   15 tablet   0   . HYDROcodone-acetaminophen (NORCO) 5-325 MG tablet   Oral   Take 1 tablet by mouth every 6 (six) hours as needed for moderate pain.   12  tablet   0   . ibuprofen (ADVIL,MOTRIN) 200 MG tablet   Oral   Take 600 mg by mouth every 6 (six) hours as needed. For headaches         . ketorolac (TORADOL) 10 MG tablet   Oral   Take 1 tablet (10 mg total) by mouth every 8 (eight) hours.   15 tablet   0   . phenazopyridine (PYRIDIUM) 200 MG tablet   Oral   Take 1 tablet (200 mg total) by mouth 3 (three) times daily as needed for pain.   20 tablet   0    Allergies Aspirin and Penicillins  History reviewed. No pertinent family history.  Social History Social History  Substance Use Topics  . Smoking status: Current Every Day Smoker -- 1.00 packs/day  . Smokeless tobacco: None  . Alcohol Use: None   Review of Systems  Constitutional: Negative for fever. Eyes: Negative for visual changes. ENT: Negative for sore throat. Cardiovascular: Negative for chest pain. Respiratory: Negative for shortness of breath. Gastrointestinal: Negative for abdominal pain, vomiting and diarrhea. Genitourinary: Negative for dysuria. Musculoskeletal: Positive for back pain. Skin: Negative for rash. Neurological: Negative for headaches, focal weakness or numbness. ____________________________________________  PHYSICAL EXAM:  VITAL SIGNS: ED Triage Vitals  Enc Vitals Group     BP 03/29/15 1029 133/59 mmHg  Pulse Rate 03/29/15 1029 109     Resp 03/29/15 1029 20     Temp 03/29/15 1029 97.8 F (36.6 C)     Temp Source 03/29/15 1029 Oral     SpO2 03/29/15 1029 98 %     Weight 03/29/15 1029 205 lb (92.987 kg)     Height 03/29/15 1029  (1.575 m)     Head Cir --      Peak Flow --      Pain Score 03/29/15 1027 10     Pain Loc --      Pain Edu? --      Excl. in GC? --    Constitutional: Alert and oriented. Well appearing and in no distress. Head: Normocephalic and atraumatic. Eyes: Conjunctivae are normal. PERRL. Normal extraocular movements Hematological/Lymphatic/Immunological: No cervical  lymphadenopathy. Cardiovascular: Normal rate, regular rhythm.  Respiratory: Normal respiratory effort. No wheezes/rales/rhonchi. Gastrointestinal: Soft and nontender. No distention, rebound, guarding, or rigidity. No CVA tenderness.  Musculoskeletal: Nontender with normal range of motion in all extremities.  Neurologic: Cranial nerves II through XII grossly intact. Normal LE DTRs bilaterally. Normal toe dorsiflexion and foot eversion. Patient with normal toe and heel raise. Normal rectal tone on exam. Mildly antalgic gait without ataxia. Normal speech and language. No gross focal neurologic deficits are appreciated. Skin:  Skin is warm, dry and intact. No rash noted. Psychiatric: Mood and affect are normal. Patient exhibits appropriate insight and judgment. ____________________________________________   RADIOLOGY  LS MRI Reviewed (02/13/14) IMPRESSION:  1. Mild lumbar spine spondylosis as described above. No significant  lumbar spine disc protrusion, foraminal stenosis or central canal  stenosis.  2. 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  ____________________________________________  PROCEDURES  Toradol 30 mg IM Valium 5 mg PO ____________________________________________  INITIAL IMPRESSION / ASSESSMENT AND PLAN / ED COURSE  Patient with acute lumbosacral muscle strain with some of her referral into the hips and buttocks bilaterally. Patient with a normal physical exam without neuromuscular deficit. She reports pain improved to a 2/10 at discharge. Patient will be discharged with prescriptions for ketorolac, Flexeril, and Vicodin the dose as needed for pain. She is encouraged to follow-up with Dr. Joice Lofts for further evaluation and management. A work note is provided for today and tomorrow as requested. Return precautions are  reviewed. ____________________________________________  FINAL CLINICAL IMPRESSION(S) / ED DIAGNOSES  Final diagnoses:  Lumbosacral pain  Lumbosacral strain, initial encounter      Lissa Hoard, PA-C 03/29/15 1355  Jene Every, MD 03/29/15 1520

## 2015-04-03 DIAGNOSIS — S39012A Strain of muscle, fascia and tendon of lower back, initial encounter: Secondary | ICD-10-CM | POA: Insufficient documentation

## 2015-04-06 ENCOUNTER — Other Ambulatory Visit: Payer: Self-pay | Admitting: Surgery

## 2015-04-06 DIAGNOSIS — M545 Low back pain: Secondary | ICD-10-CM

## 2015-04-06 DIAGNOSIS — S39012A Strain of muscle, fascia and tendon of lower back, initial encounter: Secondary | ICD-10-CM

## 2015-04-24 ENCOUNTER — Ambulatory Visit
Admission: RE | Admit: 2015-04-24 | Discharge: 2015-04-24 | Disposition: A | Discharge status: Home / Self Care | Payer: 59 | Source: Ambulatory Visit | Attending: Surgery | Admitting: Surgery

## 2015-04-24 DIAGNOSIS — M5417 Radiculopathy, lumbosacral region: Secondary | ICD-10-CM | POA: Insufficient documentation

## 2015-04-24 DIAGNOSIS — S39012A Strain of muscle, fascia and tendon of lower back, initial encounter: Secondary | ICD-10-CM | POA: Insufficient documentation

## 2015-04-24 DIAGNOSIS — M545 Low back pain: Secondary | ICD-10-CM | POA: Diagnosis present

## 2015-04-24 DIAGNOSIS — M5136 Other intervertebral disc degeneration, lumbar region: Secondary | ICD-10-CM | POA: Insufficient documentation

## 2015-07-17 ENCOUNTER — Emergency Department: Payer: 59

## 2015-07-17 ENCOUNTER — Encounter: Payer: Self-pay | Admitting: Urgent Care

## 2015-07-17 DIAGNOSIS — Z5321 Procedure and treatment not carried out due to patient leaving prior to being seen by health care provider: Secondary | ICD-10-CM | POA: Insufficient documentation

## 2015-07-17 DIAGNOSIS — R042 Hemoptysis: Secondary | ICD-10-CM | POA: Diagnosis not present

## 2015-07-17 NOTE — ED Notes (Signed)
Patient presents with c/o chest heaviness and hemoptysis x 2-3 days. (+) 1.5 ppd smoking history for years.  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

## 2015-07-18 ENCOUNTER — Emergency Department
Admission: EM | Admit: 2015-07-18 | Discharge: 2015-07-18 | Disposition: A | Discharge status: Home / Self Care | Payer: 59 | Attending: Emergency Medicine | Admitting: Emergency Medicine

## 2015-07-18 HISTORY — DX: Chronic obstructive pulmonary disease, unspecified: J44.9

## 2015-07-18 NOTE — ED Notes (Signed)
Patient called for the third time. No response. Patient eloped from the status board at this time. ED registration and charge nurse made aware so that in the event that patient presents back to the desk requesting to be seen. 

## 2015-07-18 NOTE — ED Notes (Signed)
Patient called to present to treatment area by this RN. No answer. Will reattempt.

## 2015-07-18 NOTE — ED Notes (Signed)
Patient called the second time for treatment. No response. RN will reattempt to call patient one final time prior to eloping from the status board.

## 2015-07-25 ENCOUNTER — Ambulatory Visit (INDEPENDENT_AMBULATORY_CARE_PROVIDER_SITE_OTHER): Payer: 59

## 2015-07-25 ENCOUNTER — Ambulatory Visit
Admission: EM | Admit: 2015-07-25 | Discharge: 2015-07-25 | Disposition: A | Discharge status: Home / Self Care | Payer: 59 | Attending: Family Medicine | Admitting: Family Medicine

## 2015-07-25 ENCOUNTER — Encounter: Payer: Self-pay | Admitting: *Deleted

## 2015-07-25 DIAGNOSIS — M65872 Other synovitis and tenosynovitis, left ankle and foot: Secondary | ICD-10-CM | POA: Diagnosis not present

## 2015-07-25 DIAGNOSIS — M67331 Transient synovitis, right wrist: Secondary | ICD-10-CM

## 2015-07-25 DIAGNOSIS — M659 Synovitis and tenosynovitis, unspecified: Secondary | ICD-10-CM

## 2015-07-25 MED ORDER — NAPROXEN 500 MG PO TABS: 500.0000 mg | ORAL_TABLET | Freq: Two times a day (BID) | ORAL | Status: DC

## 2015-07-25 NOTE — ED Notes (Signed)
Right hand pain, edema, redness, possible insect bite. Both lower legs swollen and painful, left worse than right. Unsure of insect bites there as well.

## 2015-07-25 NOTE — ED Provider Notes (Signed)
CSN: [id number]     Arrival date & time 07/25/15  1237 History   First MD Initiated Contact with Patient 07/25/15 1313     Chief Complaint  Patient presents with  . Insect Bite   (Consider location/radiation/quality/duration/timing/severity/associated sxs/prior Treatment) HPI this is a 40 year old female who presents with a swollen right wrist and hand has been present for about 3 days. A day later she began to notice swollen legs mostly on the left that is painful to ambulate and especially around the ankles. She has normal swelling of her legs but this is more severe this time than before. She's had no fever or chills. He has not seen aPrimary physician in about 7 years. He continues to smoke 1 pack of cigarettes per day. She has a previous history of a stroke that occurred because of "stress". She has COPD and a myocardial infarction in 2008.    Past Medical History  Diagnosis Date  . Stroke (HCC)   . MI (myocardial infarction) (HCC)     2008  . Murmur   . COPD (chronic obstructive pulmonary disease) (HCC)    History reviewed. No pertinent past surgical history. Family History  Problem Relation Age of Onset  . Diabetes Mother   . Emphysema Mother   . Hypertension Father   . Cancer Father   . Emphysema Father    Social History  Substance Use Topics  . Smoking status: Current Every Day Smoker -- 1.00 packs/day    Types: Cigarettes  . Smokeless tobacco: None  . Alcohol Use: No   OB History    No data available     Review of Systems  Constitutional: Positive for activity change. Negative for fever, chills, appetite change and fatigue.  Musculoskeletal: Positive for myalgias, joint swelling, arthralgias and gait problem.  Skin: Positive for color change.  All other systems reviewed and are negative.   Allergies  Aspirin and Penicillins  Home Medications   Prior to Admission medications   Medication Sig Start Date End Date Taking? Authorizing Provider  ibuprofen  (ADVIL,MOTRIN) 200 MG tablet Take 600 mg by mouth every 6 (six) hours as needed. For headaches   Yes Historical Provider, MD  acetaminophen (TYLENOL) 500 MG tablet Take 1,500 mg by mouth every 6 (six) hours as needed. For headaches    Historical Provider, MD  cyclobenzaprine (FLEXERIL) 5 MG tablet Take 1 tablet (5 mg total) by mouth 3 (three) times daily as needed for muscle spasms. 03/29/15   Jenise V Bacon Menshew, PA-C  HYDROcodone-acetaminophen (NORCO) 5-325 MG tablet Take 1 tablet by mouth every 6 (six) hours as needed for moderate pain. 03/29/15   Jenise V Bacon Menshew, PA-C  ketorolac (TORADOL) 10 MG tablet Take 1 tablet (10 mg total) by mouth every 8 (eight) hours. 03/29/15   Jenise V Bacon Menshew, PA-C  naproxen (NAPROSYN) 500 MG tablet Take 1 tablet (500 mg total) by mouth 2 (two) times daily with a meal. 07/25/15   Lutricia Feil, PA-C  phenazopyridine (PYRIDIUM) 200 MG tablet Take 1 tablet (200 mg total) by mouth 3 (three) times daily as needed for pain. 12/08/14 12/08/15  Phineas Semen, MD   Meds Ordered and Administered this Visit  Medications - No data to display  BP 128/79 mmHg  Pulse 85  Temp(Src) 97.9 F (36.6 C) (Oral)  Resp 16  Ht  (1.575 m)  Wt 210 lb (95.255 kg)  BMI 38.40 kg/m2  SpO2 98% No data found.   Physical Exam  Constitutional: She is oriented to person, place, and time. She appears well-developed and well-nourished. No distress.  HENT:  Head: Normocephalic and atraumatic.  Eyes: Conjunctivae are normal. Pupils are equal, round, and reactive to light.  Neck: Normal range of motion. Neck supple.  Musculoskeletal: She exhibits edema and tenderness.  Exam nation of the right hand shows obvious swelling. At the base of her hypothenar eminence there is a red non-raised area with no punctate lesions seen. Is decreased range of motion of the wrist with complaints of pain at the extremes. There is no pitting edema present.  Examination of the lower  extremities shows a calf circumference of 18 inches on the left and 17/2 inches on the right. Is no pitting edema present. There is a ruber of the lower extremities with dependency. Patient does have a marked antalgic gait. There is decreased range of motion of the left ankle; subtalar motion appears to be preserved. Is no bony tenderness present but there does appear to be tenderness to palpation over the anterior ankle with bogginess.  Neurological: She is alert and oriented to person, place, and time.  Skin: Skin is warm and dry. She is not diaphoretic.  Psychiatric: She has a normal mood and affect. Her behavior is normal. Judgment and thought content normal.  Nursing note and vitals reviewed.   ED Course  Procedures (including critical care time)  Labs Review Labs Reviewed - No data to display  Imaging Review Dg Wrist Complete Right  07/25/2015  CLINICAL DATA:  Three-day history of pain. No history of recent trauma EXAM: RIGHT WRIST - COMPLETE 3+ VIEW COMPARISON:  March 10, 2014 FINDINGS: Frontal, oblique, lateral, and ulnar deviation scaphoid images were obtained. There is no fracture or dislocation. The joint spaces appear normal. No erosive change. IMPRESSION: No fracture or dislocation.  No apparent arthropathy. Electronically Signed   By: Bretta Bang III M.D.   On: 07/25/2015 14:04   Dg Ankle Complete Left  07/25/2015  CLINICAL DATA:  Pain for 3 days.  No history of recent trauma EXAM: LEFT ANKLE COMPLETE - 3+ VIEW COMPARISON:  March 08, 2013 FINDINGS: Frontal, oblique, and lateral views were obtained. There is no demonstrable fracture or joint effusion. The ankle mortise appears intact. There is no appreciable joint space narrowing. There is a small posterior calcaneal spur. IMPRESSION: No fracture or appreciable arthropathy. Ankle mortise appears intact. There is a small posterior calcaneal spur. Electronically Signed   By: Bretta Bang III M.D.   On: 07/25/2015 14:05      Visual Acuity Review  Right Eye Distance:   Left Eye Distance:   Bilateral Distance:    Right Eye Near:   Left Eye Near:    Bilateral Near:         MDM   1. Transient synovitis of wrist, right   2. Synovitis of left ankle    New Prescriptions   NAPROXEN (NAPROSYN) 500 MG TABLET    Take 1 tablet (500 mg total) by mouth 2 (two) times daily with a meal.  Plan: 1. Test/x-ray results and diagnosis reviewed with patient 2. rx as per orders; risks, benefits, potential side effects reviewed with patient 3. Recommend supportive treatment with Rest and avoidance of symptoms as much as feasible I will start her on anti-inflammatory medication switching her to Naprosyn from her current ibuprofen. I have provided her with a Cam Walker to help her ambulate comfortably on her left ankle. Since she has the synovitis type symptoms at  both the right wrist and left ankle she may have a more systemic dose reason for her signs and symptoms. I highly recommend that she be seen by internal medicine specialist to further investigate. Is that she will make an appointment with [facility] primary on Monday to set up an appointment. In addition the patient expressed a desire to stop smoking. 4. F/u prn if symptoms worsen or don't improve      Lutricia FeilWilliam P Roemer, PA-C 07/25/15 1441

## 2015-07-30 DIAGNOSIS — M5030 Other cervical disc degeneration, unspecified cervical region: Secondary | ICD-10-CM | POA: Insufficient documentation

## 2015-08-02 ENCOUNTER — Ambulatory Visit
Admission: EM | Admit: 2015-08-02 | Discharge: 2015-08-02 | Disposition: A | Discharge status: Home / Self Care | Payer: 59 | Attending: Family Medicine | Admitting: Family Medicine

## 2015-08-02 ENCOUNTER — Encounter: Payer: Self-pay | Admitting: Gynecology

## 2015-08-02 DIAGNOSIS — M542 Cervicalgia: Secondary | ICD-10-CM | POA: Diagnosis not present

## 2015-08-02 MED ORDER — METHOCARBAMOL 500 MG PO TABS: 1500.0000 mg | ORAL_TABLET | Freq: Three times a day (TID) | ORAL | Status: DC | PRN

## 2015-08-02 NOTE — Discharge Instructions (Signed)
Use the muscle relaxer and the naproxen as needed.  Follow up as needed.  Take care  Dr. Adriana Simasook
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
## 2017-09-07 ENCOUNTER — Emergency Department
Admission: EM | Admit: 2017-09-07 | Discharge: 2017-09-07 | Disposition: A | Discharge status: Home / Self Care | Payer: 59 | Attending: Emergency Medicine | Admitting: Emergency Medicine

## 2017-09-07 ENCOUNTER — Other Ambulatory Visit: Payer: Self-pay

## 2017-09-07 ENCOUNTER — Encounter: Payer: Self-pay | Admitting: *Deleted

## 2017-09-07 DIAGNOSIS — K920 Hematemesis: Secondary | ICD-10-CM | POA: Diagnosis not present

## 2017-09-07 DIAGNOSIS — K310 Acute dilatation of stomach: Secondary | ICD-10-CM | POA: Diagnosis present

## 2017-09-07 DIAGNOSIS — Z5321 Procedure and treatment not carried out due to patient leaving prior to being seen by health care provider: Secondary | ICD-10-CM | POA: Diagnosis not present

## 2017-09-07 DIAGNOSIS — R197 Diarrhea, unspecified: Secondary | ICD-10-CM | POA: Diagnosis not present

## 2017-09-07 LAB — COMPREHENSIVE METABOLIC PANEL
ALT: 34 U/L (ref 0–44)
AST: 27 U/L (ref 15–41)
Albumin: 4.1 g/dL (ref 3.5–5.0)
Alkaline Phosphatase: 71 U/L (ref 38–126)
Anion gap: 7 (ref 5–15)
BUN: 24 mg/dL — ABNORMAL HIGH (ref 6–20)
CHLORIDE: 108 mmol/L (ref 98–111)
CO2: 25 mmol/L (ref 22–32)
CREATININE: 0.87 mg/dL (ref 0.44–1.00)
Calcium: 9.2 mg/dL (ref 8.9–10.3)
GFR calc Af Amer: >60 mL/min (ref >60)
Glucose, Bld: 100 mg/dL — ABNORMAL HIGH (ref 70–99)
POTASSIUM: 3.7 mmol/L (ref 3.5–5.1)
SODIUM: 140 mmol/L (ref 135–145)
Total Bilirubin: 0.5 mg/dL (ref 0.3–1.2)
Total Protein: 7.1 g/dL (ref 6.5–8.1)

## 2017-09-07 LAB — URINALYSIS, COMPLETE (UACMP) WITH MICROSCOPIC
BACTERIA UA: NONE SEEN
Bilirubin Urine: NEGATIVE
Glucose, UA: NEGATIVE mg/dL
Ketones, ur: NEGATIVE mg/dL
LEUKOCYTES UA: NEGATIVE
Nitrite: NEGATIVE
PROTEIN: NEGATIVE mg/dL
Specific Gravity, Urine: 1.018 (ref 1.005–1.030)
pH: 6 (ref 5.0–8.0)

## 2017-09-07 LAB — CBC
HEMATOCRIT: 45 % (ref 35.0–47.0)
Hemoglobin: 15.5 g/dL (ref 12.0–16.0)
MCH: 30.8 pg (ref 26.0–34.0)
MCHC: 34.5 g/dL (ref 32.0–36.0)
MCV: 89.4 fL (ref 80.0–100.0)
PLATELETS: 179 10*3/uL (ref 150–440)
RBC: 5.04 MIL/uL (ref 3.80–5.20)
RDW: 14.7 % — ABNORMAL HIGH (ref 11.5–14.5)
WBC: 7 10*3/uL (ref 3.6–11.0)

## 2017-09-07 LAB — LIPASE, BLOOD: LIPASE: 29 U/L (ref 11–51)

## 2017-09-07 NOTE — ED Triage Notes (Signed)
Pt reports abdominal distention and reports vomiting blood today.  Diarrhea x 3 today.  Pt taking bc powders qid.  Pt alert.

## 2017-09-07 NOTE — ED Notes (Signed)
Patient wanting to leave facility prior to seeing an MD for evaluation.  Patient advised strongly to stay for evaluation, but declined at this time.

## 2017-09-08 ENCOUNTER — Telehealth: Payer: Self-pay | Admitting: Emergency Medicine

## 2017-09-08 NOTE — Telephone Encounter (Signed)
Called patient due to lwot to inquire about condition and follow up plans. Left message.   

## 2017-09-10 ENCOUNTER — Encounter: Payer: Self-pay | Admitting: Emergency Medicine

## 2017-09-10 ENCOUNTER — Other Ambulatory Visit: Payer: Self-pay

## 2017-09-10 ENCOUNTER — Emergency Department
Admission: EM | Admit: 2017-09-10 | Discharge: 2017-09-10 | Disposition: A | Discharge status: Home / Self Care | Payer: 59 | Attending: Emergency Medicine | Admitting: Emergency Medicine

## 2017-09-10 DIAGNOSIS — F17210 Nicotine dependence, cigarettes, uncomplicated: Secondary | ICD-10-CM | POA: Insufficient documentation

## 2017-09-10 DIAGNOSIS — J449 Chronic obstructive pulmonary disease, unspecified: Secondary | ICD-10-CM | POA: Insufficient documentation

## 2017-09-10 DIAGNOSIS — Z8673 Personal history of transient ischemic attack (TIA), and cerebral infarction without residual deficits: Secondary | ICD-10-CM | POA: Insufficient documentation

## 2017-09-10 DIAGNOSIS — K2900 Acute gastritis without bleeding: Secondary | ICD-10-CM

## 2017-09-10 DIAGNOSIS — I252 Old myocardial infarction: Secondary | ICD-10-CM | POA: Insufficient documentation

## 2017-09-10 LAB — URINALYSIS, COMPLETE (UACMP) WITH MICROSCOPIC
BACTERIA UA: NONE SEEN
Bilirubin Urine: NEGATIVE
GLUCOSE, UA: NEGATIVE mg/dL
KETONES UR: NEGATIVE mg/dL
Leukocytes, UA: NEGATIVE
Nitrite: NEGATIVE
Protein, ur: NEGATIVE mg/dL
Specific Gravity, Urine: 1.018 (ref 1.005–1.030)
pH: 5 (ref 5.0–8.0)

## 2017-09-10 LAB — COMPREHENSIVE METABOLIC PANEL
ALBUMIN: 4.4 g/dL (ref 3.5–5.0)
ALT: 34 U/L (ref 0–44)
ANION GAP: 6 (ref 5–15)
AST: 25 U/L (ref 15–41)
Alkaline Phosphatase: 70 U/L (ref 38–126)
BUN: 16 mg/dL (ref 6–20)
CALCIUM: 10.3 mg/dL (ref 8.9–10.3)
CHLORIDE: 107 mmol/L (ref 98–111)
CO2: 28 mmol/L (ref 22–32)
Creatinine, Ser: 0.95 mg/dL (ref 0.44–1.00)
GFR calc non Af Amer: >60 mL/min (ref >60)
GLUCOSE: 121 mg/dL — AB (ref 70–99)
POTASSIUM: 3.8 mmol/L (ref 3.5–5.1)
SODIUM: 141 mmol/L (ref 135–145)
Total Bilirubin: 0.3 mg/dL (ref 0.3–1.2)
Total Protein: 7.5 g/dL (ref 6.5–8.1)

## 2017-09-10 LAB — CBC
HEMATOCRIT: 46.3 % (ref 35.0–47.0)
HEMOGLOBIN: 16.1 g/dL — AB (ref 12.0–16.0)
MCH: 31.1 pg (ref 26.0–34.0)
MCHC: 34.9 g/dL (ref 32.0–36.0)
MCV: 89.1 fL (ref 80.0–100.0)
Platelets: 201 10*3/uL (ref 150–440)
RBC: 5.2 MIL/uL (ref 3.80–5.20)
RDW: 14.8 % — ABNORMAL HIGH (ref 11.5–14.5)
WBC: 6.2 10*3/uL (ref 3.6–11.0)

## 2017-09-10 LAB — LIPASE, BLOOD: LIPASE: 26 U/L (ref 11–51)

## 2017-09-10 MED ORDER — GI COCKTAIL ~~LOC~~
30.0000 mL | Freq: Once | ORAL | Status: AC
  Administered 2017-09-10: 30 mL via ORAL
  Filled 2017-09-10: qty 30

## 2017-09-10 MED ORDER — SUCRALFATE 1 G PO TABS: 1.0000 g | ORAL_TABLET | Freq: Three times a day (TID) | ORAL | 0 refills | Status: DC

## 2017-09-10 MED ORDER — OMEPRAZOLE 20 MG PO CPDR: 20.0000 mg | DELAYED_RELEASE_CAPSULE | Freq: Every day | ORAL | 1 refills | Status: DC

## 2017-09-10 MED ORDER — ONDANSETRON 4 MG PO TBDP: 4.0000 mg | ORAL_TABLET | Freq: Three times a day (TID) | ORAL | 0 refills | Status: DC | PRN

## 2017-09-10 MED ORDER — ONDANSETRON 4 MG PO TBDP
4.0000 mg | ORAL_TABLET | Freq: Once | ORAL | Status: AC
  Administered 2017-09-10: 4 mg via ORAL
  Filled 2017-09-10: qty 1

## 2017-09-10 NOTE — ED Triage Notes (Signed)
Pt presents to ED c/o n/v/d x3 days. States she has seen blood in vomit. States she takes a lot of ibuprofen and BC powders for migraines.

## 2017-09-10 NOTE — ED Notes (Addendum)
Pt presents with vomiting, heartburn, belching x 4 days. She states she has not been able to hold down any foods for 4 days as well. Pt states she has had diarrhea for 1 month. She also c/o low energy levels. Pt alert & oriented with nad noted.

## 2017-09-10 NOTE — ED Notes (Addendum)
Pt reports that she takes 5 BC powders every day for complex migraines.

## 2017-09-10 NOTE — ED Provider Notes (Signed)
[facility] Emergency Department Provider Note   ____________________________________________    I have reviewed the triage vital signs and the nursing notes.   HISTORY  Chief Complaint Vomiting and Abdominal Pain     HPI Kelly Horton is a 42 y.o. female who presents with complaints of epigastric burning sensation.  Patient reports over the last 2 days she has had burning in her epigastrium with some burning sensation in her central chest.  She also has had some episodes of vomiting with a very small amount of blood mixed in with food stuff.  She also states she has been belching frequently.  Denies dark or black stools.  Does report using NSAIDs for chronic pain.  No dizziness.  Past Medical History:  Diagnosis Date  . COPD (chronic obstructive pulmonary disease) (HCC)   . MI (myocardial infarction) (HCC)    2008  . Murmur   . Stroke [facility](HCC)     There are no active problems to display for this patient.   History reviewed. No pertinent surgical history.  Prior to Admission medications   Medication Sig Start Date End Date Taking? Authorizing Provider  acetaminophen (TYLENOL) 500 MG tablet Take 1,500 mg by mouth every 6 (six) hours as needed. For headaches    [provider]  cyclobenzaprine (FLEXERIL) 5 MG tablet Take 1 tablet (5 mg total) by mouth 3 (three) times daily as needed for muscle spasms. 03/29/15   Menshew, Charlesetta IvoryJenise V Bacon, PA-C  HYDROcodone-acetaminophen (NORCO) 5-325 MG tablet Take 1 tablet by mouth every 6 (six) hours as needed for moderate pain. 03/29/15   Menshew, Charlesetta IvoryJenise V Bacon, PA-C  ibuprofen (ADVIL,MOTRIN) 200 MG tablet Take 600 mg by mouth every 6 (six) hours as needed. For headaches    [provider]  ketorolac (TORADOL) 10 MG tablet Take 1 tablet (10 mg total) by mouth every 8 (eight) hours. 03/29/15   Menshew, Charlesetta IvoryJenise V Bacon, PA-C  methocarbamol (ROBAXIN) 500 MG tablet Take 3 tablets (1,500 mg total)  by mouth every 8 (eight) hours as needed for muscle spasms. 08/02/15   Tommie Samsook, Jayce G, DO  naproxen (NAPROSYN) 500 MG tablet Take 1 tablet (500 mg total) by mouth 2 (two) times daily with a meal. 07/25/15   Lutricia Feiloemer, William P, PA-C  omeprazole (PRILOSEC) 20 MG capsule Take 1 capsule (20 mg total) by mouth daily. 09/10/17 09/10/18  Jene EveryKinner, Quamesha Mullet, MD  ondansetron (ZOFRAN ODT) 4 MG disintegrating tablet Take 1 tablet (4 mg total) by mouth every 8 (eight) hours as needed for nausea or vomiting. 09/10/17   Jene EveryKinner, Matia Zelada, MD  sucralfate (CARAFATE) 1 g tablet Take 1 tablet (1 g total) by mouth 4 (four) times daily -  with meals and at bedtime for 10 days. 09/10/17 09/20/17  Jene EveryKinner, Milynn Quirion, MD     Allergies Aspirin and Penicillins  Family History  Problem Relation Age of Onset  . Diabetes Mother   . Emphysema Mother   . Hypertension Father   . Cancer Father   . Emphysema Father     Social History Social History   Tobacco Use  . Smoking status: Current Every Day Smoker    Packs/day: 1.00    Types: Cigarettes  . Smokeless tobacco: Never Used  Substance Use Topics  . Alcohol use: No    Alcohol/week: 0.0 standard drinks  . Drug use: Yes    Types: Marijuana    Review of Systems  Constitutional: No fever/chills Eyes: No visual changes.  ENT:  No sore throat. Cardiovascular: As above Respiratory: Denies shortness of breath. Gastrointestinal: As above Genitourinary: Negative for dysuria. Musculoskeletal: Negative for back pain. Skin: Negative for rash. Neurological: Negative for headaches    ____________________________________________   PHYSICAL EXAM:  VITAL SIGNS: ED Triage Vitals  Enc Vitals Group     BP 09/10/17 1800 131/84     Pulse Rate 09/10/17 1800 99     Resp 09/10/17 1800 18     Temp 09/10/17 1800 98.2 F (36.8 C)     Temp Source 09/10/17 1800 Oral     SpO2 09/10/17 1800 98 %     Weight 09/10/17 1801 104.3 kg (230 lb)     Height 09/10/17 1801 1.575 m (5\' 2" )     Head  Circumference --      Peak Flow --      Pain Score 09/10/17 1800 5     Pain Loc --      Pain Edu? --      Excl. in GC? --     Constitutional: Alert and oriented.   Nose: No congestion/rhinnorhea. Mouth/Throat: Mucous membranes are moist.    Cardiovascular: Normal rate, regular rhythm. Grossly normal heart sounds.  Good peripheral circulation. Respiratory: Normal respiratory effort.  No retractions.  Gastrointestinal: Mild epigastric tenderness.  . No distention.   Musculoskeletal: No lower extremity tenderness nor edema.  Warm and well perfused Neurologic:  Normal speech and language. No gross focal neurologic deficits are appreciated.  Skin:  Skin is warm, dry and intact. No rash noted. Psychiatric: Mood and affect are normal. Speech and behavior are normal.  ____________________________________________   LABS (all labs ordered are listed, but only abnormal results are displayed)  Labs Reviewed  COMPREHENSIVE METABOLIC PANEL - Abnormal; Notable for the following components:      Result Value   Glucose, Bld 121 (*)    All other components within normal limits  CBC - Abnormal; Notable for the following components:   Hemoglobin 16.1 (*)    RDW 14.8 (*)    All other components within normal limits  URINALYSIS, COMPLETE (UACMP) WITH MICROSCOPIC - Abnormal; Notable for the following components:   Color, Urine YELLOW (*)    APPearance HAZY (*)    Hgb urine dipstick SMALL (*)    All other components within normal limits  LIPASE, BLOOD   ____________________________________________  EKG  None ____________________________________________  RADIOLOGY   ____________________________________________   PROCEDURES  Procedure(s) performed: No  Procedures   Critical Care performed: No ____________________________________________   INITIAL IMPRESSION / ASSESSMENT AND PLAN / ED COURSE  Pertinent labs & imaging results that were available during my care of the patient  were reviewed by me and considered in my medical decision making (see chart for details).  Patient presents with belching, burning sensation in the epigastrium and nausea.  Suspect gastritis/PUD versus pancreatitis.  Lipase is normal.  We will treat with Zofran ODT and trial GI cocktail.  After treatment patient reports near resolution of symptoms.  This is consistent with likely gastritis.  Will discharge with PPI, Carafate, Zofran, outpatient follow-up, recommended d/c NSAIDs    ____________________________________________   FINAL CLINICAL IMPRESSION(S) / ED DIAGNOSES  Final diagnoses:  Acute gastritis without hemorrhage, unspecified gastritis type        Note:  This document was prepared using Dragon voice recognition software and may include unintentional dictation errors.    Jene Every, MD 09/10/17 2029

## 2017-09-10 NOTE — ED Triage Notes (Signed)
1st RN: Pt c/o n/v/d x several days.

## 2017-10-17 ENCOUNTER — Emergency Department
Admission: EM | Admit: 2017-10-17 | Discharge: 2017-10-17 | Disposition: A | Discharge status: Home / Self Care | Payer: 59 | Attending: Emergency Medicine | Admitting: Emergency Medicine

## 2017-10-17 ENCOUNTER — Encounter: Payer: Self-pay | Admitting: Emergency Medicine

## 2017-10-17 DIAGNOSIS — L509 Urticaria, unspecified: Secondary | ICD-10-CM | POA: Diagnosis not present

## 2017-10-17 DIAGNOSIS — F17210 Nicotine dependence, cigarettes, uncomplicated: Secondary | ICD-10-CM | POA: Diagnosis not present

## 2017-10-17 DIAGNOSIS — Z79899 Other long term (current) drug therapy: Secondary | ICD-10-CM | POA: Diagnosis not present

## 2017-10-17 DIAGNOSIS — J449 Chronic obstructive pulmonary disease, unspecified: Secondary | ICD-10-CM | POA: Diagnosis not present

## 2017-10-17 MED ORDER — HYDROXYZINE HCL 50 MG PO TABS: 50.0000 mg | ORAL_TABLET | Freq: Three times a day (TID) | ORAL | 0 refills | Status: DC | PRN

## 2017-10-17 MED ORDER — METHYLPREDNISOLONE SODIUM SUCC 125 MG IJ SOLR
125.0000 mg | Freq: Once | INTRAMUSCULAR | Status: AC
  Administered 2017-10-17: 125 mg via INTRAMUSCULAR
  Filled 2017-10-17: qty 2

## 2017-10-17 MED ORDER — METHYLPREDNISOLONE 4 MG PO TBPK: ORAL_TABLET | ORAL | 0 refills | Status: DC

## 2017-10-17 MED ORDER — HYDROXYZINE HCL 50 MG PO TABS
50.0000 mg | ORAL_TABLET | Freq: Once | ORAL | Status: AC
  Administered 2017-10-17: 50 mg via ORAL
  Filled 2017-10-17: qty 1

## 2017-10-17 NOTE — ED Notes (Signed)
See triage note   States she thinks she may have been stung by something 2 days ago  Developed hives yesterday with itching  Woke up with am with facial swelling

## 2017-10-17 NOTE — ED Triage Notes (Signed)
Pt reports has been having an allergic reaction for the last 2 days. Pt reports no new foods, soaps, clothes or anything else. Pt reports she does have 3 bites on her left ankle and thinks it could be related to that. Pt reports areas itch. Denies SOB or feeling like her throat is swelling.

## 2017-10-17 NOTE — ED Provider Notes (Signed)
[facility] Emergency Department Provider Note   ____________________________________________   First MD Initiated Contact with Patient 10/17/17 [phone]     (approximate)  I have reviewed the triage vital signs and the nursing notes.   HISTORY  Chief Complaint Rash and Urticaria    HPI Kelly Horton is a 42 y.o. female patient complain of diffuse rash with itching for 2 days.  Patient denies dyspnea or angioedema.  Patient states she went fishing and with patient she returned she started developing age.  Patient states she thinks she might have been bitent by an unknown insect's right ankle.  Past Medical History:  Diagnosis Date  . COPD (chronic obstructive pulmonary disease) (HCC)   . MI (myocardial infarction) (HCC)    2008  . Murmur   . Stroke [facility])     There are no active problems to display for this patient.   History reviewed. No pertinent surgical history.  Prior to Admission medications   Medication Sig Start Date End Date Taking? Authorizing Provider  acetaminophen (TYLENOL) 500 MG tablet Take 1,500 mg by mouth every 6 (six) hours as needed. For headaches    [provider]  cyclobenzaprine (FLEXERIL) 5 MG tablet Take 1 tablet (5 mg total) by mouth 3 (three) times daily as needed for muscle spasms. 03/29/15   Menshew, Charlesetta Ivory, PA-C  HYDROcodone-acetaminophen (NORCO) 5-325 MG tablet Take 1 tablet by mouth every 6 (six) hours as needed for moderate pain. 03/29/15   Menshew, Charlesetta Ivory, PA-C  hydrOXYzine (ATARAX/VISTARIL) 50 MG tablet Take 1 tablet (50 mg total) by mouth 3 (three) times daily as needed. 10/17/17   Joni Reining, PA-C  ibuprofen (ADVIL,MOTRIN) 200 MG tablet Take 600 mg by mouth every 6 (six) hours as needed. For headaches    [provider]  ketorolac (TORADOL) 10 MG tablet Take 1 tablet (10 mg total) by mouth every 8 (eight) hours. 03/29/15   Menshew, Charlesetta Ivory, PA-C  methocarbamol  (ROBAXIN) 500 MG tablet Take 3 tablets (1,500 mg total) by mouth every 8 (eight) hours as needed for muscle spasms. 08/02/15   Tommie Sams, DO  methylPREDNISolone (MEDROL DOSEPAK) 4 MG TBPK tablet Take Tapered dose as directed 10/17/17   Joni Reining, PA-C  naproxen (NAPROSYN) 500 MG tablet Take 1 tablet (500 mg total) by mouth 2 (two) times daily with a meal. 07/25/15   Lutricia Feil, PA-C  omeprazole (PRILOSEC) 20 MG capsule Take 1 capsule (20 mg total) by mouth daily. 09/10/17 09/10/18  Jene Every, MD  ondansetron (ZOFRAN ODT) 4 MG disintegrating tablet Take 1 tablet (4 mg total) by mouth every 8 (eight) hours as needed for nausea or vomiting. 09/10/17   Jene Every, MD  sucralfate (CARAFATE) 1 g tablet Take 1 tablet (1 g total) by mouth 4 (four) times daily -  with meals and at bedtime for 10 days. 09/10/17 09/20/17  Jene Every, MD    Allergies Penicillins  Family History  Problem Relation Age of Onset  . Diabetes Mother   . Emphysema Mother   . Hypertension Father   . Cancer Father   . Emphysema Father     Social History Social History   Tobacco Use  . Smoking status: Current Every Day Smoker    Packs/day: 1.00    Types: Cigarettes  . Smokeless tobacco: Never Used  Substance Use Topics  . Alcohol use: No    Alcohol/week: 0.0 standard drinks  . Drug  use: Yes    Types: Marijuana    Review of Systems Constitutional: No fever/chills Eyes: No visual changes. ENT: No sore throat. Cardiovascular: Denies chest pain. Respiratory: Denies shortness of breath. Gastrointestinal: No abdominal pain.  No nausea, no vomiting.  No diarrhea.  No constipation. Genitourinary: Negative for dysuria. Musculoskeletal: Negative for back pain. Skin: Positive for rash. Neurological: Negative for headaches, focal weakness or numbness. Allergic/Immunilogical: Penicillin ____________________________________________   PHYSICAL EXAM:  VITAL SIGNS: ED Triage Vitals  Enc Vitals  Group     BP 10/17/17 0926 136/79     Pulse Rate 10/17/17 0926 (!) 112     Resp 10/17/17 0926 20     Temp 10/17/17 0926 98.3 F (36.8 C)     Temp Source 10/17/17 0926 Oral     SpO2 10/17/17 0926 96 %     Weight 10/17/17 0926 230 lb (104.3 kg)     Height 10/17/17 0926 5\' 2"  (1.575 m)     Head Circumference --      Peak Flow --      Pain Score 10/17/17 0939 0     Pain Loc --      Pain Edu? --      Excl. in GC? --    Constitutional: Alert and oriented. Well appearing and in no acute distress. Eyes: Conjunctivae are normal. PERRL. EOMI. Head: Atraumatic. Nose: No congestion/rhinnorhea. Mouth/Throat: Mucous membranes are moist.  Oropharynx non-erythematous. Neck: No stridor. Hematological/Lymphatic/Immunilogical: No cervical lymphadenopathy. Cardiovascular: Normal rate, regular rhythm. Grossly normal heart sounds.  Good peripheral circulation. Respiratory: Normal respiratory effort.  No retractions. Lungs CTAB. Gastrointestinal: Soft and nontender. No distention. No abdominal bruits. No CVA tenderness. Musculoskeletal: No lower extremity tenderness nor edema.  No joint effusions. Neurologic:  Normal speech and language. No gross focal neurologic deficits are appreciated. No gait instability. Skin:  Skin is warm, dry and intact.  Diffuse erythematous macular lesions.  Psychiatric: Mood and affect are normal. Speech and behavior are normal.  ____________________________________________   LABS (all labs ordered are listed, but only abnormal results are displayed)  Labs Reviewed - No data to display ____________________________________________  EKG   ____________________________________________  RADIOLOGY  ED MD interpretation:    Official radiology report(s): No results found.  ____________________________________________   PROCEDURES  Procedure(s) performed:   Procedures  Critical Care performed: No  ____________________________________________   INITIAL  IMPRESSION / ASSESSMENT AND PLAN / ED COURSE  As part of my medical decision making, I reviewed the following data within the electronic MEDICAL RECORD NUMBER    Urticaria etiology unknown.  Patient responded well to Solu-Medrol and Atarax.  Patient given discharge care instruction advised to continue these medications.  Patient advised follow-up with PCP as needed.      ____________________________________________   FINAL CLINICAL IMPRESSION(S) / ED DIAGNOSES  Final diagnoses:  Urticaria     ED Discharge Orders         Ordered    hydrOXYzine (ATARAX/VISTARIL) 50 MG tablet  3 times daily PRN     10/17/17 1056    methylPREDNISolone (MEDROL DOSEPAK) 4 MG TBPK tablet     10/17/17 1056           Note:  This document was prepared using Dragon voice recognition software and may include unintentional dictation errors.    Joni Reining, PA-C 10/17/17 1058    Governor Rooks, MD 10/21/17 [phone]

## 2018-01-13 ENCOUNTER — Emergency Department
Admission: EM | Admit: 2018-01-13 | Discharge: 2018-01-13 | Disposition: A | Discharge status: Home / Self Care | Payer: 59 | Attending: Emergency Medicine | Admitting: Emergency Medicine

## 2018-01-13 ENCOUNTER — Other Ambulatory Visit: Payer: Self-pay

## 2018-01-13 ENCOUNTER — Emergency Department: Payer: 59

## 2018-01-13 DIAGNOSIS — W1839XA Other fall on same level, initial encounter: Secondary | ICD-10-CM | POA: Insufficient documentation

## 2018-01-13 DIAGNOSIS — F17210 Nicotine dependence, cigarettes, uncomplicated: Secondary | ICD-10-CM | POA: Insufficient documentation

## 2018-01-13 DIAGNOSIS — Y939 Activity, unspecified: Secondary | ICD-10-CM | POA: Insufficient documentation

## 2018-01-13 DIAGNOSIS — Y929 Unspecified place or not applicable: Secondary | ICD-10-CM | POA: Insufficient documentation

## 2018-01-13 DIAGNOSIS — S93402A Sprain of unspecified ligament of left ankle, initial encounter: Secondary | ICD-10-CM

## 2018-01-13 DIAGNOSIS — Z79899 Other long term (current) drug therapy: Secondary | ICD-10-CM | POA: Insufficient documentation

## 2018-01-13 DIAGNOSIS — J449 Chronic obstructive pulmonary disease, unspecified: Secondary | ICD-10-CM | POA: Insufficient documentation

## 2018-01-13 DIAGNOSIS — Y999 Unspecified external cause status: Secondary | ICD-10-CM | POA: Insufficient documentation

## 2018-01-13 MED ORDER — HYDROCODONE-ACETAMINOPHEN 5-325 MG PO TABS
1.0000 | ORAL_TABLET | Freq: Once | ORAL | Status: AC
  Administered 2018-01-13: 1 via ORAL
  Filled 2018-01-13: qty 1

## 2018-01-13 MED ORDER — HYDROCODONE-ACETAMINOPHEN 5-325 MG PO TABS: 1.0000 | ORAL_TABLET | Freq: Four times a day (QID) | ORAL | 0 refills | Status: DC | PRN

## 2018-01-13 MED ORDER — IBUPROFEN 800 MG PO TABS
800.0000 mg | ORAL_TABLET | Freq: Once | ORAL | Status: AC
  Administered 2018-01-13: 800 mg via ORAL
  Filled 2018-01-13: qty 1

## 2018-01-13 MED ORDER — IBUPROFEN 800 MG PO TABS: 800.0000 mg | ORAL_TABLET | Freq: Three times a day (TID) | ORAL | 0 refills | Status: DC | PRN

## 2018-01-13 NOTE — ED Notes (Signed)
No peripheral IV placed this visit.   Discharge instructions reviewed with patient. Questions fielded by this RN. Patient verbalizes understanding of instructions. Patient discharged home in stable condition per sung. No acute distress noted at time of discharge.   

## 2018-01-13 NOTE — Discharge Instructions (Addendum)
1. Take pain medicines as needed (Motrin/Norco #15). 2. Wear ankle splint & use walker as needed. 3. Elevate affected area & apply ice over splint several times daily. 4. Return to the ER for worsening symptoms, increased swelling, numbness/tingling or other concerns.

## 2018-01-13 NOTE — ED Provider Notes (Signed)
[facility] Emergency Department Provider Note   ____________________________________________   First MD Initiated Contact with Patient 01/13/18 [phone]     (approximate)  I have reviewed the triage vital signs and the nursing notes.   HISTORY  Chief Complaint Leg Pain    HPI Kelly Horton is a 42 y.o. female who presents to the ED from home with a chief complaint of left ankle pain.  Patient states she has a bad left knee that gives out on her.  Suffered a mechanical fall approximately 1 PM, twisting her ankle.  Denies striking head or LOC.  Other than ankle pain and swelling, patient voices no other complaints.  Denies anticoagulant use.   Past Medical History:  Diagnosis Date  . COPD (chronic obstructive pulmonary disease) (HCC)   . MI (myocardial infarction) (HCC)    2008  . Murmur   . Stroke [facility](HCC)     There are no active problems to display for this patient.   No past surgical history on file.  Prior to Admission medications   Medication Sig Start Date End Date Taking? Authorizing Provider  acetaminophen (TYLENOL) 500 MG tablet Take 1,500 mg by mouth every 6 (six) hours as needed. For headaches    [provider]  cyclobenzaprine (FLEXERIL) 5 MG tablet Take 1 tablet (5 mg total) by mouth 3 (three) times daily as needed for muscle spasms. 03/29/15   Menshew, Charlesetta IvoryJenise V Bacon, PA-C  HYDROcodone-acetaminophen (NORCO) 5-325 MG tablet Take 1 tablet by mouth every 6 (six) hours as needed for moderate pain. 03/29/15   Menshew, Charlesetta IvoryJenise V Bacon, PA-C  hydrOXYzine (ATARAX/VISTARIL) 50 MG tablet Take 1 tablet (50 mg total) by mouth 3 (three) times daily as needed. 10/17/17   Joni ReiningSmith, Ronald K, PA-C  ibuprofen (ADVIL,MOTRIN) 200 MG tablet Take 600 mg by mouth every 6 (six) hours as needed. For headaches    [provider]  ketorolac (TORADOL) 10 MG tablet Take 1 tablet (10 mg total) by mouth every 8 (eight) hours. 03/29/15   Menshew,  Charlesetta IvoryJenise V Bacon, PA-C  methocarbamol (ROBAXIN) 500 MG tablet Take 3 tablets (1,500 mg total) by mouth every 8 (eight) hours as needed for muscle spasms. 08/02/15   Tommie Samsook, Jayce G, DO  methylPREDNISolone (MEDROL DOSEPAK) 4 MG TBPK tablet Take Tapered dose as directed 10/17/17   Joni ReiningSmith, Ronald K, PA-C  naproxen (NAPROSYN) 500 MG tablet Take 1 tablet (500 mg total) by mouth 2 (two) times daily with a meal. 07/25/15   Lutricia Feiloemer, William P, PA-C  omeprazole (PRILOSEC) 20 MG capsule Take 1 capsule (20 mg total) by mouth daily. 09/10/17 09/10/18  Jene EveryKinner, Robert, MD  ondansetron (ZOFRAN ODT) 4 MG disintegrating tablet Take 1 tablet (4 mg total) by mouth every 8 (eight) hours as needed for nausea or vomiting. 09/10/17   Jene EveryKinner, Robert, MD  sucralfate (CARAFATE) 1 g tablet Take 1 tablet (1 g total) by mouth 4 (four) times daily -  with meals and at bedtime for 10 days. 09/10/17 09/20/17  Jene EveryKinner, Robert, MD    Allergies Penicillins  Family History  Problem Relation Age of Onset  . Diabetes Mother   . Emphysema Mother   . Hypertension Father   . Cancer Father   . Emphysema Father     Social History Social History   Tobacco Use  . Smoking status: Current Every Day Smoker    Packs/day: 1.00    Types: Cigarettes  . Smokeless tobacco: Never Used  Substance Use Topics  .  Alcohol use: No    Alcohol/week: 0.0 standard drinks  . Drug use: Yes    Types: Marijuana    Review of Systems  Constitutional: No fever/chills Eyes: No visual changes. ENT: No sore throat. Cardiovascular: Denies chest pain. Respiratory: Denies shortness of breath. Gastrointestinal: No abdominal pain.  No nausea, no vomiting.  No diarrhea.  No constipation. Genitourinary: Negative for dysuria. Musculoskeletal: Positive for left ankle pain.  Negative for back pain. Skin: Negative for rash. Neurological: Negative for headaches, focal weakness or numbness.   ____________________________________________   PHYSICAL EXAM:  VITAL  SIGNS: ED Triage Vitals [01/13/18 0117]  Enc Vitals Group     BP 113/73     Pulse Rate 90     Resp 20     Temp 98 F (36.7 C)     Temp Source Oral     SpO2 98 %     Weight 218 lb (98.9 kg)     Height 5\' 2"  (1.575 m)     Head Circumference      Peak Flow      Pain Score 5     Pain Loc      Pain Edu?      Excl. in GC?     Constitutional: Asleep, awakened for exam.  Alert and oriented. Well appearing and in no acute distress. Eyes: Conjunctivae are normal. PERRL. EOMI. Head: Atraumatic. Nose: No congestion/rhinnorhea. Mouth/Throat: Mucous membranes are moist.  Oropharynx non-erythematous. Neck: No stridor.  No cervical spine tenderness to palpation. Cardiovascular: Normal rate, regular rhythm. Grossly normal heart sounds.  Good peripheral circulation. Respiratory: Normal respiratory effort.  No retractions. Lungs CTAB. Gastrointestinal: Soft and nontender. No distention. No abdominal bruits. No CVA tenderness. Musculoskeletal:  Left lateral malleolus with mild swelling.  Tender to palpation.  Limited range of motion secondary to pain.  2+ distal pulses.  Brisk, less than 5-second capillary refill. Neurologic:  Normal speech and language. No gross focal neurologic deficits are appreciated.  Skin:  Skin is warm, dry and intact. No rash noted. Psychiatric: Mood and affect are normal. Speech and behavior are normal.  ____________________________________________   LABS (all labs ordered are listed, but only abnormal results are displayed)  Labs Reviewed - No data to display ____________________________________________  EKG  None ____________________________________________  RADIOLOGY  ED MD interpretation: No acute traumatic injuries of left ankle or tip/fib  Official radiology report(s): Dg Tibia/fibula Left  Result Date: 01/13/2018 CLINICAL DATA:  Status post fall today with pain. EXAM: LEFT TIBIA AND FIBULA - 2 VIEW COMPARISON:  None. FINDINGS: There is no evidence  of fracture or other focal bone lesions. Soft tissues are unremarkable. IMPRESSION: Negative. Electronically Signed   By: Sherian ReinWei-Chen  Lin M.D.   On: 01/13/2018 01:48   Dg Ankle Complete Left  Result Date: 01/13/2018 CLINICAL DATA:  Status post fall today with left ankle pain. EXAM: LEFT ANKLE COMPLETE - 3+ VIEW COMPARISON:  None. FINDINGS: There is no evidence of fracture, dislocation, or joint effusion. Calcification at the Achilles tendon insertion to the calcaneus is noted. Mild soft tissue swelling is identified around the ankle. IMPRESSION: No acute fracture or dislocation. Electronically Signed   By: Sherian ReinWei-Chen  Lin M.D.   On: 01/13/2018 01:49    ____________________________________________   PROCEDURES  Procedure(s) performed: None  Procedures  Critical Care performed: No  ____________________________________________   INITIAL IMPRESSION / ASSESSMENT AND PLAN / ED COURSE  As part of my medical decision making, I reviewed the following data within the electronic medical  record:  Nursing notes reviewed and incorporated, Radiograph reviewed and Notes from prior ED visits   42 year old female who presents status post mechanical fall with left ankle sprain.  Will place an ankle stirrup splint, provide walker per patient's request.  Will discharge home with prescriptions for Motrin and Norco to take as needed for pain.  She will follow-up with orthopedics as needed.  Strict return precautions given.  Patient verbalizes understanding agrees with plan of care.      ____________________________________________   FINAL CLINICAL IMPRESSION(S) / ED DIAGNOSES  Final diagnoses:  Sprain of left ankle, unspecified ligament, initial encounter     ED Discharge Orders    None       Note:  This document was prepared using Dragon voice recognition software and may include unintentional dictation errors.    Irean Hong, MD 01/13/18 0600

## 2018-01-13 NOTE — ED Triage Notes (Signed)
Pt fell today states has a bad left knee that "gives out", states pain to left lower leg and left ankle.

## 2018-01-13 NOTE — ED Notes (Addendum)
Pt sleeping, wakes to reports "my knee gave out" while walking and subsequent fall, reports hx of same, pt alert and oriented, denies LOC or head strike  No obvious swelling or deformity, pt CMS intact to extremity

## 2018-02-07 ENCOUNTER — Other Ambulatory Visit: Payer: Self-pay

## 2018-02-07 ENCOUNTER — Emergency Department
Admission: EM | Admit: 2018-02-07 | Discharge: 2018-02-08 | Disposition: A | Discharge status: Home / Self Care | Payer: 59 | Attending: Emergency Medicine | Admitting: Emergency Medicine

## 2018-02-07 DIAGNOSIS — Z79899 Other long term (current) drug therapy: Secondary | ICD-10-CM | POA: Insufficient documentation

## 2018-02-07 DIAGNOSIS — R7989 Other specified abnormal findings of blood chemistry: Secondary | ICD-10-CM

## 2018-02-07 DIAGNOSIS — J449 Chronic obstructive pulmonary disease, unspecified: Secondary | ICD-10-CM | POA: Insufficient documentation

## 2018-02-07 DIAGNOSIS — Z8673 Personal history of transient ischemic attack (TIA), and cerebral infarction without residual deficits: Secondary | ICD-10-CM | POA: Insufficient documentation

## 2018-02-07 DIAGNOSIS — R0789 Other chest pain: Secondary | ICD-10-CM

## 2018-02-07 DIAGNOSIS — R252 Cramp and spasm: Secondary | ICD-10-CM

## 2018-02-07 DIAGNOSIS — R42 Dizziness and giddiness: Secondary | ICD-10-CM

## 2018-02-07 DIAGNOSIS — R945 Abnormal results of liver function studies: Secondary | ICD-10-CM | POA: Insufficient documentation

## 2018-02-07 DIAGNOSIS — F17210 Nicotine dependence, cigarettes, uncomplicated: Secondary | ICD-10-CM | POA: Insufficient documentation

## 2018-02-07 LAB — CBC WITH DIFFERENTIAL/PLATELET
ABS IMMATURE GRANULOCYTES: 0.02 10*3/uL (ref 0.00–0.07)
Basophils Absolute: 0.1 10*3/uL (ref 0.0–0.1)
Basophils Relative: 1 %
Eosinophils Absolute: 0.2 10*3/uL (ref 0.0–0.5)
Eosinophils Relative: 3 %
HCT: 43.5 % (ref 36.0–46.0)
Hemoglobin: 14 g/dL (ref 12.0–15.0)
Immature Granulocytes: 0 %
Lymphocytes Relative: 43 %
Lymphs Abs: 3.4 10*3/uL (ref 0.7–4.0)
MCH: 30 pg (ref 26.0–34.0)
MCHC: 32.2 g/dL (ref 30.0–36.0)
MCV: 93.3 fL (ref 80.0–100.0)
MONO ABS: 0.9 10*3/uL (ref 0.1–1.0)
Monocytes Relative: 11 %
NEUTROS ABS: 3.3 10*3/uL (ref 1.7–7.7)
Neutrophils Relative %: 42 %
Platelets: 189 10*3/uL (ref 150–400)
RBC: 4.66 MIL/uL (ref 3.87–5.11)
RDW: 14.1 % (ref 11.5–15.5)
WBC: 7.8 10*3/uL (ref 4.0–10.5)
nRBC: 0 % (ref 0.0–0.2)

## 2018-02-07 NOTE — ED Provider Notes (Signed)
[facility] Emergency Department Provider Note  ____________________________________________   First MD Initiated Contact with Patient 02/07/18 2253     (approximate)  I have reviewed the triage vital signs and the nursing notes.   HISTORY  Chief Complaint Dizziness    HPI Kelly Horton is a 43 y.o. female with medical history as listed below who presents by private vehicle for evaluation of an episode of muscle cramping and bilateral anterior chest wall that radiated into her back and caused her to be briefly dizzy.  She says that the episode lasted about 10 minutes and she definitively describes it as cramping, "like a charley horse she would have any your leg but it happened in my ribs".  It briefly took her breath away and although the symptoms were severe and acute in onset, she was not particularly worried about it.  It passed without treatment and she was not going to pursue treatment, but her colleagues were concerned about her and called EMS.  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  After the end of her shift she came and to be checked out.  She denies any residual discomfort except for a little bit of tenderness to palpation of both sides of her chest wall.  She denies fever/chills, chest pain "on the inside", abdominal pain, nausea, vomiting, dysuria.  She says every now and then she feels a fluttering in her chest but that is normal because of her history of paroxysmal atrial fibrillation.  She says that the muscle cramping occurs sometimes because she is on her feet all day at work.  No new medications, no changes in therapy recently.  No drugs or alcohol, and she stopped smoking about a week ago.  Past Medical History:  Diagnosis Date  . COPD (chronic obstructive pulmonary disease) (HCC)   . MI (myocardial  infarction) (HCC)    2008  . Murmur   . Stroke [facility])     There are no active problems to display for this patient.   No past surgical history on file.  Prior to Admission medications   Medication Sig Start Date End Date Taking? Authorizing Provider  acetaminophen (TYLENOL) 500 MG tablet Take 1,500 mg by mouth every 6 (six) hours as needed. For headaches    [provider]  cyclobenzaprine (FLEXERIL) 5 MG tablet Take 1 tablet (5 mg total) by mouth 3 (three) times daily as needed for muscle spasms. 03/29/15   Menshew, Charlesetta Ivory, PA-C  HYDROcodone-acetaminophen (NORCO) 5-325 MG tablet Take 1 tablet by mouth every 6 (six) hours as needed for moderate pain. 01/13/18   Irean Hong, MD  hydrOXYzine (ATARAX/VISTARIL) 50 MG tablet Take 1 tablet (50 mg total) by mouth 3 (three) times daily as needed. 10/17/17   Joni Reining, PA-C  ibuprofen (ADVIL,MOTRIN) 800 MG tablet Take 1 tablet (800 mg total) by mouth every 8 (eight) hours as needed for moderate pain. 01/13/18   Irean Hong, MD  ketorolac (TORADOL) 10 MG tablet Take 1 tablet (10 mg total) by mouth every 8 (eight) hours. 03/29/15   Menshew, Charlesetta Ivory, PA-C  methocarbamol (ROBAXIN) 500 MG tablet Take 3 tablets (1,500 mg total) by mouth every 8 (eight) hours as needed for muscle spasms. 08/02/15   Tommie Sams, DO  methylPREDNISolone (MEDROL DOSEPAK) 4 MG TBPK tablet Take Tapered dose  as directed 10/17/17   Joni Reining, PA-C  naproxen (NAPROSYN) 500 MG tablet Take 1 tablet (500 mg total) by mouth 2 (two) times daily with a meal. 07/25/15   Lutricia Feil, PA-C  omeprazole (PRILOSEC) 20 MG capsule Take 1 capsule (20 mg total) by mouth daily. 09/10/17 09/10/18  Jene Every, MD  ondansetron (ZOFRAN ODT) 4 MG disintegrating tablet Take 1 tablet (4 mg total) by mouth every 8 (eight) hours as needed for nausea or vomiting. 09/10/17   Jene Every, MD  sucralfate (CARAFATE) 1 g tablet Take 1 tablet (1 g total) by mouth 4 (four)  times daily -  with meals and at bedtime for 10 days. 09/10/17 09/20/17  Jene Every, MD    Allergies Penicillins and Shellfish allergy  Family History  Problem Relation Age of Onset  . Diabetes Mother   . Emphysema Mother   . Hypertension Father   . Cancer Father   . Emphysema Father     Social History Social History   Tobacco Use  . Smoking status: Current Every Day Smoker    Packs/day: 1.00    Types: Cigarettes  . Smokeless tobacco: Never Used  Substance Use Topics  . Alcohol use: No    Alcohol/week: 0.0 standard drinks  . Drug use: Yes    Types: Marijuana    Review of Systems Constitutional: No fever/chills Eyes: No visual changes. ENT: No sore throat. Cardiovascular: No internal chest pain, chest wall cramping and tenderness as described above. Respiratory: Denies shortness of breath. Gastrointestinal: No abdominal pain.  No nausea, no vomiting.  No diarrhea.  No constipation. Genitourinary: Negative for dysuria. Musculoskeletal: Muscle cramping in bilateral anterior chest wall.  The pain radiated to her back briefly.  Negative for neck pain.   Integumentary: Negative for rash. Neurological: Negative for headaches, focal weakness or numbness.   ____________________________________________   PHYSICAL EXAM:  VITAL SIGNS: ED Triage Vitals [02/07/18 2203]  Enc Vitals Group     BP 127/84     Pulse Rate 95     Resp 20     Temp 97.7 F (36.5 C)     Temp Source Oral     SpO2 100 %     Weight 98.9 kg (218 lb)     Height 1.575 m (5\' 2" )     Head Circumference      Peak Flow      Pain Score 3     Pain Loc      Pain Edu?      Excl. in GC?     Constitutional: Alert and oriented. Well appearing and in no acute distress. Eyes: Conjunctivae are normal.  Head: Atraumatic. Nose: No congestion/rhinnorhea. Mouth/Throat: Mucous membranes are moist. Neck: No stridor.  No meningeal signs.   Cardiovascular: Normal rate, regular rhythm. Good peripheral  circulation. Grossly normal heart sounds. Respiratory: Normal respiratory effort.  No retractions. Lungs CTAB. Gastrointestinal: Soft and nontender. No distention.  Musculoskeletal: Mild tenderness to palpation of the anterior chest wall.  No lower extremity tenderness nor edema. No gross deformities of extremities. Neurologic:  Normal speech and language. No gross focal neurologic deficits are appreciated.  Skin:  Skin is warm, dry and intact. No rash noted. Psychiatric: Mood and affect are normal. Speech and behavior are normal.  ____________________________________________   LABS (all labs ordered are listed, but only abnormal results are displayed)  Labs Reviewed  COMPREHENSIVE METABOLIC PANEL - Abnormal; Notable for the following components:      Result  Value   Glucose, Bld 109 (*)    BUN 25 (*)    Creatinine, Ser 1.06 (*)    AST 71 (*)    ALT 131 (*)    All other components within normal limits  TROPONIN I  CBC WITH DIFFERENTIAL/PLATELET  LIPASE, BLOOD  MAGNESIUM   ____________________________________________  EKG  ED ECG REPORT I, Loleta Roseory Ahmia Colford, the attending physician, personally viewed and interpreted this ECG.  Date: 02/07/2018 EKG Time: 22: 10 Rate: 89 Rhythm: normal sinus rhythm QRS Axis: Left axis deviation Intervals: Questionable left anterior fascicular block ST/T Wave abnormalities: Non-specific ST segment / T-wave changes, but no clear evidence of acute ischemia. Narrative Interpretation: no definitive evidence of acute ischemia; does not meet STEMI criteria.   ____________________________________________  RADIOLOGY   ED MD interpretation: No indication for imaging  Official radiology report(s): No results found.  ____________________________________________   PROCEDURES  Critical Care performed: No   Procedure(s) performed:   Procedures   ____________________________________________   INITIAL IMPRESSION / ASSESSMENT AND PLAN / ED  COURSE  As part of my medical decision making, I reviewed the following data within the electronic MEDICAL RECORD NUMBER Nursing notes reviewed and incorporated, Labs reviewed , EKG interpreted , Old chart reviewed, Radiograph reviewed  and Notes from prior ED visits    Differential diagnosis includes, but is not limited to, muscle cramps, nonspecific musculoskeletal chest wall pain, costochondritis, pneumonia, all the typical emergent causes of chest pain including but not limited to ACS, aortic dissection, pulmonary embolism, pneumothorax.  The patient has had no respiratory symptoms and very clearly and repeatedly describes the pain as a cramping sensation.  She only came in to the emergency department tonight because the paramedics suggested she get checked out, and the paramedics were called by her colleagues.  She is in no distress and has a reassuring physical exam and an EKG with no signs of ischemia.  She is PERC negative.  Given her history I will check a troponin and basic labs including electrolytes given the muscle cramping, but she is comfortable with the plan for discharge and outpatient follow-up of her lab results are within normal limits I think that is appropriate.  The episode occurred about 3 hours ago and a second troponin is not necessary or indicated.  Clinical Course as of Feb 09 52  Wed Feb 07, 2018  2355 Normal CBC  CBC with Differential/Platelet [CF]  Thu Feb 08, 2018  [id number] Labs are within normal limits other than very mild AST and ALT elevation which is nonspecific and likely noncontributory given the lack of abdominal pain and tenderness.  I updated the patient about the results and encouraged outpatient follow-up as needed.  I gave my usual and customary return precautions.   [CF]    Clinical Course User Index [CF] Loleta RoseForbach, Sherle Mello, MD    ____________________________________________  FINAL CLINICAL IMPRESSION(S) / ED DIAGNOSES  Final diagnoses:  Muscle cramping    Chest wall tenderness  Dizziness  Elevated LFTs     MEDICATIONS GIVEN DURING THIS VISIT:  Medications - No data to display   ED Discharge Orders    None       Note:  This document was prepared using Dragon voice recognition software and may include unintentional dictation errors.    Loleta RoseForbach, Boss Danielsen, MD 02/08/18 [phone]

## 2018-02-07 NOTE — ED Triage Notes (Signed)
Pt in with co episode of dizziness at work, states had bilat rib pain that went up bilat shoulder and back. States has hx of MI, CHF and afib.

## 2018-02-07 NOTE — Discharge Instructions (Addendum)
Your workup in the Emergency Department today was reassuring.  We did not find any specific abnormalities other than a very slight elevation of two labs (AST and ALT) which are non-specific and likely unrelated to your current visit.  We recommend you drink plenty of fluids, take your regular medications and/or any new ones prescribed today, and follow up with the doctor(s) listed in these documents as recommended.  Return to the Emergency Department if you develop new or worsening symptoms that concern you.

## 2018-02-08 LAB — COMPREHENSIVE METABOLIC PANEL
ALK PHOS: 97 U/L (ref 38–126)
ALT: 131 U/L — ABNORMAL HIGH (ref 0–44)
AST: 71 U/L — ABNORMAL HIGH (ref 15–41)
Albumin: 3.9 g/dL (ref 3.5–5.0)
Anion gap: 7 (ref 5–15)
BUN: 25 mg/dL — ABNORMAL HIGH (ref 6–20)
CO2: 25 mmol/L (ref 22–32)
Calcium: 9 mg/dL (ref 8.9–10.3)
Chloride: 108 mmol/L (ref 98–111)
Creatinine, Ser: 1.06 mg/dL — ABNORMAL HIGH (ref 0.44–1.00)
GFR calc Af Amer: >60 mL/min (ref >60)
GFR calc non Af Amer: >60 mL/min (ref >60)
Glucose, Bld: 109 mg/dL — ABNORMAL HIGH (ref 70–99)
Potassium: 3.6 mmol/L (ref 3.5–5.1)
SODIUM: 140 mmol/L (ref 135–145)
TOTAL PROTEIN: 7 g/dL (ref 6.5–8.1)
Total Bilirubin: 0.6 mg/dL (ref 0.3–1.2)

## 2018-02-08 LAB — MAGNESIUM: Magnesium: 2 mg/dL (ref 1.7–2.4)

## 2018-02-08 LAB — TROPONIN I: Troponin I: <0.03 ng/mL (ref <0.03)

## 2018-02-08 LAB — LIPASE, BLOOD: Lipase: 29 U/L (ref 11–51)

## 2018-04-09 ENCOUNTER — Other Ambulatory Visit: Payer: Self-pay

## 2018-04-09 ENCOUNTER — Emergency Department
Admission: EM | Admit: 2018-04-09 | Discharge: 2018-04-09 | Disposition: A | Discharge status: Home / Self Care | Payer: Self-pay | Attending: Emergency Medicine | Admitting: Emergency Medicine

## 2018-04-09 ENCOUNTER — Encounter: Payer: Self-pay | Admitting: *Deleted

## 2018-04-09 ENCOUNTER — Emergency Department: Payer: Self-pay

## 2018-04-09 DIAGNOSIS — J449 Chronic obstructive pulmonary disease, unspecified: Secondary | ICD-10-CM | POA: Insufficient documentation

## 2018-04-09 DIAGNOSIS — R0789 Other chest pain: Secondary | ICD-10-CM | POA: Insufficient documentation

## 2018-04-09 DIAGNOSIS — R05 Cough: Secondary | ICD-10-CM | POA: Insufficient documentation

## 2018-04-09 DIAGNOSIS — Z79899 Other long term (current) drug therapy: Secondary | ICD-10-CM | POA: Insufficient documentation

## 2018-04-09 DIAGNOSIS — B9789 Other viral agents as the cause of diseases classified elsewhere: Secondary | ICD-10-CM | POA: Insufficient documentation

## 2018-04-09 DIAGNOSIS — J069 Acute upper respiratory infection, unspecified: Secondary | ICD-10-CM | POA: Insufficient documentation

## 2018-04-09 DIAGNOSIS — R0602 Shortness of breath: Secondary | ICD-10-CM | POA: Insufficient documentation

## 2018-04-09 DIAGNOSIS — Z87891 Personal history of nicotine dependence: Secondary | ICD-10-CM | POA: Insufficient documentation

## 2018-04-09 DIAGNOSIS — I252 Old myocardial infarction: Secondary | ICD-10-CM | POA: Insufficient documentation

## 2018-04-09 DIAGNOSIS — Z8673 Personal history of transient ischemic attack (TIA), and cerebral infarction without residual deficits: Secondary | ICD-10-CM | POA: Insufficient documentation

## 2018-04-09 DIAGNOSIS — F1210 Cannabis abuse, uncomplicated: Secondary | ICD-10-CM | POA: Insufficient documentation

## 2018-04-09 MED ORDER — PREDNISONE 10 MG PO TABS: ORAL_TABLET | ORAL | 0 refills | Status: DC

## 2018-04-09 MED ORDER — BENZONATATE 100 MG PO CAPS: 100.0000 mg | ORAL_CAPSULE | Freq: Three times a day (TID) | ORAL | 0 refills | Status: DC | PRN

## 2018-04-09 NOTE — ED Provider Notes (Signed)
[facility] Emergency Department Provider Note  ____________________________________________   None    (approximate)  I have reviewed the triage vital signs and the nursing notes.   HISTORY  Chief Complaint Shortness of Breath    HPI Kelly Horton is a 43 y.o. female that presents to the emergency department for evaluation of nasal congestion, non productive cough, SOB for 3 days. Paitent has some chest tightness, only with coughing. Patient states that while sitting and not coughing, she does not have any chest discomfort. Patient had a fever 3 days ago but fever broke 48 hours ago. Family members have similar symptoms and she is here with her husband and her future daughter in law that also have similar URI symptoms. Patient states that her husband and son still have fevers. Her son was diagnosed clinically this morning for covid so the whole family came to get tested. Patient has a history of COPD and used her inhaler this morning with relief. She has been using nose sprays for her congestion with relief as well.     Past Medical History:  Diagnosis Date  . COPD (chronic obstructive pulmonary disease) (HCC)   . MI (myocardial infarction) (HCC)    2008  . Murmur   . Stroke [facility])     There are no active problems to display for this patient.   History reviewed. No pertinent surgical history.  Prior to Admission medications   Medication Sig Start Date End Date Taking? Authorizing Provider  acetaminophen (TYLENOL) 500 MG tablet Take 1,500 mg by mouth every 6 (six) hours as needed. For headaches    [provider]  benzonatate (TESSALON PERLES) 100 MG capsule Take 1 capsule (100 mg total) by mouth 3 (three) times daily as needed for cough. 04/09/18 04/09/19  Enid Derry, PA-C  cyclobenzaprine (FLEXERIL) 5 MG tablet Take 1 tablet (5 mg total) by mouth 3 (three) times daily as needed for muscle spasms. 03/29/15   Menshew, Charlesetta Ivory,  PA-C  HYDROcodone-acetaminophen (NORCO) 5-325 MG tablet Take 1 tablet by mouth every 6 (six) hours as needed for moderate pain. 01/13/18   Irean Hong, MD  hydrOXYzine (ATARAX/VISTARIL) 50 MG tablet Take 1 tablet (50 mg total) by mouth 3 (three) times daily as needed. 10/17/17   Joni Reining, PA-C  ibuprofen (ADVIL,MOTRIN) 800 MG tablet Take 1 tablet (800 mg total) by mouth every 8 (eight) hours as needed for moderate pain. 01/13/18   Irean Hong, MD  ketorolac (TORADOL) 10 MG tablet Take 1 tablet (10 mg total) by mouth every 8 (eight) hours. 03/29/15   Menshew, Charlesetta Ivory, PA-C  methocarbamol (ROBAXIN) 500 MG tablet Take 3 tablets (1,500 mg total) by mouth every 8 (eight) hours as needed for muscle spasms. 08/02/15   Tommie Sams, DO  methylPREDNISolone (MEDROL DOSEPAK) 4 MG TBPK tablet Take Tapered dose as directed 10/17/17   Joni Reining, PA-C  naproxen (NAPROSYN) 500 MG tablet Take 1 tablet (500 mg total) by mouth 2 (two) times daily with a meal. 07/25/15   Lutricia Feil, PA-C  omeprazole (PRILOSEC) 20 MG capsule Take 1 capsule (20 mg total) by mouth daily. 09/10/17 09/10/18  Jene Every, MD  ondansetron (ZOFRAN ODT) 4 MG disintegrating tablet Take 1 tablet (4 mg total) by mouth every 8 (eight) hours as needed for nausea or vomiting. 09/10/17   Jene Every, MD  predniSONE (DELTASONE) 10 MG tablet Take 6 tablets on day 1, take 5 tablets  on day 2, take 4 tablets on day 3, take 3 tablets on day 4, take 2 tablets on day 5, take 1 tablet on day 6 04/09/18   Enid DerryWagner, Avielle Imbert, PA-C  sucralfate (CARAFATE) 1 g tablet Take 1 tablet (1 g total) by mouth 4 (four) times daily -  with meals and at bedtime for 10 days. 09/10/17 09/20/17  Jene EveryKinner, Robert, MD    Allergies Penicillins and Shellfish allergy  Family History  Problem Relation Age of Onset  . Diabetes Mother   . Emphysema Mother   . Hypertension Father   . Cancer Father   . Emphysema Father     Social History Social History    Tobacco Use  . Smoking status: Former Smoker    Packs/day: 1.00    Types: Cigarettes  . Smokeless tobacco: Never Used  Substance Use Topics  . Alcohol use: No    Alcohol/week: 0.0 standard drinks  . Drug use: Yes    Types: Marijuana    Review of Systems Constitutional: Positive for fever/chills. ENT: No congestion/rhinorrhea. Cardiovascular: Positive for chest pain with coughing. Respiratory: Positive for cough. Positive for SOB. Musculoskeletal: Negative for neck pain nor stiffness. Integumentary: Negative for rash. Neurological: No focal weakness nor numbness.   ____________________________________________   PHYSICAL EXAM:  VITAL SIGNS: ED Triage Vitals  Enc Vitals Group     BP 04/09/18 1510 116/71     Pulse Rate 04/09/18 1510 96     Resp 04/09/18 1510 18     Temp 04/09/18 1510 98.5 F (36.9 C)     Temp Source 04/09/18 1510 Oral     SpO2 04/09/18 1510 100 %     Weight 04/09/18 1512 224 lb (101.6 kg)     Height 04/09/18 1512 5\' 2"  (1.575 m)     Head Circumference --      Peak Flow --      Pain Score 04/09/18 1512 6     Pain Loc --      Pain Edu? --      Excl. in GC? --     Constitutional: Alert and oriented. Generally well appearing and in no acute distress. Eyes: Conjunctivae are normal.  Nose: No congestion or rhinorrhea. Neck: No stridor.  No meningeal signs.   Cardiovascular: Grossly normal heart sounds. Respiratory: Lungs are CTAB. No tachypnea or retractions. Musculoskeletal: No lower extremity tenderness nor edema. No gross deformities of extremities. Neurologic:  Normal speech and language. No gross focal neurologic deficits are appreciated.  Skin:  Skin is warm, dry and intact. No rash noted.   ____________________________________________   LABS (all labs ordered are listed, but only abnormal results are displayed)  Labs Reviewed - No data to display  ____________________________________________   RADIOLOGY I, Enid DerryAshley Hani Patnode, personally  viewed and evaluated these images (plain radiographs) as part of my medical decision making, as well as reviewing the written report by the radiologist.  Official radiology report(s): Dg Chest 1 View  Result Date: 04/09/2018 CLINICAL DATA:  Short of breath, cough, chest pain EXAM: CHEST  1 VIEW COMPARISON:  07/17/2015 FINDINGS: Normal heart size. Lungs clear. No pneumothorax. No pleural effusion. IMPRESSION: No active cardiopulmonary disease. Electronically Signed   By: Jolaine ClickArthur  Hoss M.D.   On: 04/09/2018 15:59    ____________________________________________    INITIAL IMPRESSION / MDM / ASSESSMENT AND PLAN / ED COURSE    Patients symptoms are consistent with viral URI. Patient's vital signs are stable. Symptoms are likely viral in etiology. Chest xray negative  for acute cardiopulmonary processes. Patient present with her family members that all have similar symptoms.  Patient states that her son was clinically diagnosed with Covid-19 today.  Patient came to the emergency department requesting Covid-19 testing.  This patient currently does not meet criteria for coronavirus testing and I explained that in detail to the patient.  The evaluation today is reassuring with no evidence of emergent medical condition that requires further work-up or evaluation or inpatient treatment.  I provided follow-up recommendations and strict return precautions.  He was given prescriptions for Tessalon Perles and prednisone.  She will continue her albuterol inhalers.  The patient understands and agrees with the plan.   ____________________________________________  FINAL CLINICAL IMPRESSION(S) / ED DIAGNOSES  Final diagnoses:  Viral URI with cough  Chronic obstructive pulmonary disease, unspecified COPD type (HCC)        Note:  This document was prepared using Dragon voice recognition software and may include unintentional dictation errors.   Enid Derry, PA-C 04/09/18 2145    Nita Sickle, MD  04/09/18 2212

## 2018-04-09 NOTE — ED Notes (Signed)
Pt verbalized understanding of d/c instructions, RX, and f/u care. No further questions at this time. Pt ambulatory to the exit with steady gait  

## 2018-05-11 ENCOUNTER — Emergency Department
Admission: EM | Admit: 2018-05-11 | Discharge: 2018-05-12 | Disposition: A | Discharge status: Home / Self Care | Payer: 59 | Attending: Emergency Medicine | Admitting: Emergency Medicine

## 2018-05-11 ENCOUNTER — Emergency Department: Payer: 59

## 2018-05-11 ENCOUNTER — Other Ambulatory Visit: Payer: Self-pay

## 2018-05-11 DIAGNOSIS — Y999 Unspecified external cause status: Secondary | ICD-10-CM | POA: Diagnosis not present

## 2018-05-11 DIAGNOSIS — X501XXA Overexertion from prolonged static or awkward postures, initial encounter: Secondary | ICD-10-CM | POA: Insufficient documentation

## 2018-05-11 DIAGNOSIS — Z79899 Other long term (current) drug therapy: Secondary | ICD-10-CM | POA: Diagnosis not present

## 2018-05-11 DIAGNOSIS — Z87891 Personal history of nicotine dependence: Secondary | ICD-10-CM | POA: Insufficient documentation

## 2018-05-11 DIAGNOSIS — Z8673 Personal history of transient ischemic attack (TIA), and cerebral infarction without residual deficits: Secondary | ICD-10-CM | POA: Diagnosis not present

## 2018-05-11 DIAGNOSIS — J449 Chronic obstructive pulmonary disease, unspecified: Secondary | ICD-10-CM | POA: Diagnosis not present

## 2018-05-11 DIAGNOSIS — Y9389 Activity, other specified: Secondary | ICD-10-CM | POA: Insufficient documentation

## 2018-05-11 DIAGNOSIS — Y929 Unspecified place or not applicable: Secondary | ICD-10-CM | POA: Diagnosis not present

## 2018-05-11 DIAGNOSIS — S99912A Unspecified injury of left ankle, initial encounter: Secondary | ICD-10-CM | POA: Diagnosis present

## 2018-05-11 DIAGNOSIS — S93492A Sprain of other ligament of left ankle, initial encounter: Secondary | ICD-10-CM | POA: Insufficient documentation

## 2018-05-11 DIAGNOSIS — Y939 Activity, unspecified: Secondary | ICD-10-CM | POA: Insufficient documentation

## 2018-05-11 MED ORDER — FREE SPIRIT KNEE/LEG WALKER MISC: 1.0000 | Freq: Every day | 0 refills | Status: DC

## 2018-05-11 MED ORDER — TRAMADOL HCL 50 MG PO TABS
50.0000 mg | ORAL_TABLET | Freq: Once | ORAL | Status: AC
  Administered 2018-05-11: 50 mg via ORAL
  Filled 2018-05-11: qty 1

## 2018-05-11 MED ORDER — TRAMADOL HCL 50 MG PO TABS: 50.0000 mg | ORAL_TABLET | Freq: Four times a day (QID) | ORAL | 0 refills | Status: AC | PRN

## 2018-05-11 NOTE — ED Triage Notes (Signed)
Stepped in a hole and turned her left ankle. Swelling noted. Patient states difficult to stand on due to pain.

## 2018-05-11 NOTE — ED Notes (Signed)
Patient to ED for possible sprained ankle. Patient states she stepped in a hole and turned her ankle. Swelling noted.

## 2018-05-11 NOTE — ED Provider Notes (Signed)
[facility] Emergency Department Provider Note  ____________________________________________  Time seen: Approximately 11:51 PM  I have reviewed the triage vital signs and the nursing notes.   HISTORY  Chief Complaint Ankle Pain    HPI Bronwen Pendergraft is a 43 y.o. female presents to the emergency department with acute left ankle pain after patient sustained an inversion type ankle injury while playing water tag outside earlier in the day.  Patient reports that her foot fell into a hole.  She noticed swelling immediately.  She did not hit her head or neck during injury.  She has sustained multiple left ankle sprains in the past.  Patient has had some tingling along the dorsal aspect of the left foot since incident occurred.  No abrasions or lacerations.  No other alleviating measures been attempted.        Past Medical History:  Diagnosis Date  . COPD (chronic obstructive pulmonary disease) (HCC)   . MI (myocardial infarction) (HCC)    2008  . Murmur   . Stroke [facility])     There are no active problems to display for this patient.   History reviewed. No pertinent surgical history.  Prior to Admission medications   Medication Sig Start Date End Date Taking? Authorizing Provider  acetaminophen (TYLENOL) 500 MG tablet Take 1,500 mg by mouth every 6 (six) hours as needed. For headaches    [provider]  benzonatate (TESSALON PERLES) 100 MG capsule Take 1 capsule (100 mg total) by mouth 3 (three) times daily as needed for cough. 04/09/18 04/09/19  Enid Derry, PA-C  cyclobenzaprine (FLEXERIL) 5 MG tablet Take 1 tablet (5 mg total) by mouth 3 (three) times daily as needed for muscle spasms. 03/29/15   Menshew, Charlesetta Ivory, PA-C  HYDROcodone-acetaminophen (NORCO) 5-325 MG tablet Take 1 tablet by mouth every 6 (six) hours as needed for moderate pain. 01/13/18   Irean Hong, MD  hydrOXYzine (ATARAX/VISTARIL) 50 MG tablet Take 1 tablet (50 mg  total) by mouth 3 (three) times daily as needed. 10/17/17   Joni Reining, PA-C  ibuprofen (ADVIL,MOTRIN) 800 MG tablet Take 1 tablet (800 mg total) by mouth every 8 (eight) hours as needed for moderate pain. 01/13/18   Irean Hong, MD  ketorolac (TORADOL) 10 MG tablet Take 1 tablet (10 mg total) by mouth every 8 (eight) hours. 03/29/15   Menshew, Charlesetta Ivory, PA-C  methocarbamol (ROBAXIN) 500 MG tablet Take 3 tablets (1,500 mg total) by mouth every 8 (eight) hours as needed for muscle spasms. 08/02/15   Tommie Sams, DO  methylPREDNISolone (MEDROL DOSEPAK) 4 MG TBPK tablet Take Tapered dose as directed 10/17/17   Joni Reining, PA-C  Misc. Devices (FREE SPIRIT KNEE/LEG WALKER) MISC 1 Device by Does not apply route daily. 05/11/18   Orvil Feil, PA-C  naproxen (NAPROSYN) 500 MG tablet Take 1 tablet (500 mg total) by mouth 2 (two) times daily with a meal. 07/25/15   Lutricia Feil, PA-C  omeprazole (PRILOSEC) 20 MG capsule Take 1 capsule (20 mg total) by mouth daily. 09/10/17 09/10/18  Jene Every, MD  ondansetron (ZOFRAN ODT) 4 MG disintegrating tablet Take 1 tablet (4 mg total) by mouth every 8 (eight) hours as needed for nausea or vomiting. 09/10/17   Jene Every, MD  predniSONE (DELTASONE) 10 MG tablet Take 6 tablets on day 1, take 5 tablets on day 2, take 4 tablets on day 3, take 3 tablets on day 4, take  2 tablets on day 5, take 1 tablet on day 6 04/09/18   Enid DerryWagner, Ashley, PA-C  sucralfate (CARAFATE) 1 g tablet Take 1 tablet (1 g total) by mouth 4 (four) times daily -  with meals and at bedtime for 10 days. 09/10/17 09/20/17  Jene EveryKinner, Robert, MD  traMADol (ULTRAM) 50 MG tablet Take 1 tablet (50 mg total) by mouth every 6 (six) hours as needed for up to 3 days. 05/11/18 05/14/18  Orvil FeilWoods, Jorah Hua M, PA-C    Allergies Penicillins and Shellfish allergy  Family History  Problem Relation Age of Onset  . Diabetes Mother   . Emphysema Mother   . Hypertension Father   . Cancer Father   .  Emphysema Father     Social History Social History   Tobacco Use  . Smoking status: Former Smoker    Packs/day: 1.00    Types: Cigarettes  . Smokeless tobacco: Never Used  Substance Use Topics  . Alcohol use: No    Alcohol/week: 0.0 standard drinks  . Drug use: Yes    Types: Marijuana     Review of Systems  Constitutional: No fever/chills Eyes: No visual changes. No discharge ENT: No upper respiratory complaints. Cardiovascular: no chest pain. Respiratory: no cough. No SOB. Gastrointestinal: No abdominal pain.  No nausea, no vomiting.  No diarrhea.  No constipation. Musculoskeletal: Patient has left ankle pain. Skin: Negative for rash, abrasions, lacerations, ecchymosis. Neurological: Negative for headaches, focal weakness or numbness.   ____________________________________________   PHYSICAL EXAM:  VITAL SIGNS: ED Triage Vitals  Enc Vitals Group     BP 05/11/18 2308 139/79     Pulse Rate 05/11/18 2308 98     Resp 05/11/18 2308 18     Temp 05/11/18 2308 98.3 F (36.8 C)     Temp Source 05/11/18 2308 Oral     SpO2 05/11/18 2308 94 %     Weight 05/11/18 2300 245 lb (111.1 kg)     Height 05/11/18 2300 5\' 3"  (1.6 m)     Head Circumference --      Peak Flow --      Pain Score 05/11/18 2259 5     Pain Loc --      Pain Edu? --      Excl. in GC? --      Constitutional: Alert and oriented. Well appearing and in no acute distress. Eyes: Conjunctivae are normal. PERRL. EOMI. Head: Atraumatic. Cardiovascular: Normal rate, regular rhythm. Normal S1 and S2.  Good peripheral circulation. Respiratory: Normal respiratory effort without tachypnea or retractions. Lungs CTAB. Good air entry to the bases with no decreased or absent breath sounds. Musculoskeletal: Patient is unable to perform full range of motion at the left ankle, likely secondary to pain.  She has tenderness with palpation of the anterior talofibular ligament and across the dorsal aspect of the left foot.   No pain with palpation over the fibular head.  Palpable dorsalis pedis pulse, left.  Capillary refill less than 3 seconds. Neurologic:  Normal speech and language. No gross focal neurologic deficits are appreciated.  Skin:  Skin is warm, dry and intact. No rash noted. Psychiatric: Mood and affect are normal. Speech and behavior are normal. Patient exhibits appropriate insight and judgement.   ____________________________________________   LABS (all labs ordered are listed, but only abnormal results are displayed)  Labs Reviewed - No data to display ____________________________________________  EKG   ____________________________________________  RADIOLOGY I personally viewed and evaluated these images as part of  my medical decision making, as well as reviewing the written report by the radiologist.  Dg Ankle Complete Left  Result Date: 05/11/2018 CLINICAL DATA:  Twisting injury of left ankle.  Stepped in hole. EXAM: LEFT ANKLE COMPLETE - 3+ VIEW COMPARISON:  01/13/2018 left ankle FINDINGS: There is no evidence of fracture, dislocation, or joint effusion. There is no evidence of arthropathy or other focal bone abnormality. Soft tissues are unremarkable. IMPRESSION: Negative. Electronically Signed   By: Deatra Robinson M.D.   On: 05/11/2018 23:33    ____________________________________________    PROCEDURES  Procedure(s) performed:    Procedures    Medications  traMADol (ULTRAM) tablet 50 mg (has no administration in time range)     ____________________________________________   INITIAL IMPRESSION / ASSESSMENT AND PLAN / ED COURSE  Pertinent labs & imaging results that were available during my care of the patient were reviewed by me and considered in my medical decision making (see chart for details).  Review of the [place] CSRS was performed in accordance of the [facility] prior to dispensing any controlled drugs.          Assessment and plan Left ankle pain Patient  presents to the emergency department with acute left ankle pain after she fell in a hole while playing water tag.  X-ray examination of the left foot revealed no acute abnormality.  Physical exam, patient had tenderness with palpation of the anterior talofibular ligament and along the dorsal aspect of the left foot.  An Ace wrap was applied in the emergency department and a postop shoe was given.  Patient requested a knee scooter prescription and one was prescribed at discharge.  She was given a referral to podiatry.  Tramadol was given in the emergency department with pain patient was discharged with a short course of tramadol she cannot tolerate anti-inflammatories due to history of recent GI bleed.  No other alleviating measures have been attempted.   ____________________________________________  FINAL CLINICAL IMPRESSION(S) / ED DIAGNOSES  Final diagnoses:  Sprain of anterior talofibular ligament of left ankle, initial encounter      NEW MEDICATIONS STARTED DURING THIS VISIT:  ED Discharge Orders         Ordered    traMADol (ULTRAM) 50 MG tablet  Every 6 hours PRN     05/11/18 2348    Misc. Devices (FREE SPIRIT KNEE/LEG WALKER) MISC  Daily     05/11/18 2349              This chart was dictated using voice recognition software/Dragon. Despite best efforts to proofread, errors can occur which can change the meaning. Any change was purely unintentional.    Orvil Feil, PA-C 05/11/18 2354    Minna Antis, MD 05/13/18 [phone]

## 2018-05-12 NOTE — ED Notes (Signed)
This RN reviewed discharge instructions, follow-up care, prescriptions, cryotherapy, and need for elevation with patient. Patient verbalized understanding of all reviewed information.  Patient stable, with no distress noted at this time. 

## 2018-06-22 ENCOUNTER — Other Ambulatory Visit: Payer: Self-pay

## 2018-06-22 ENCOUNTER — Emergency Department
Admission: EM | Admit: 2018-06-22 | Discharge: 2018-06-22 | Disposition: A | Discharge status: Home / Self Care | Payer: 59 | Attending: Emergency Medicine | Admitting: Emergency Medicine

## 2018-06-22 ENCOUNTER — Emergency Department: Payer: 59

## 2018-06-22 DIAGNOSIS — Z5321 Procedure and treatment not carried out due to patient leaving prior to being seen by health care provider: Secondary | ICD-10-CM | POA: Insufficient documentation

## 2018-06-22 DIAGNOSIS — M25512 Pain in left shoulder: Secondary | ICD-10-CM | POA: Insufficient documentation

## 2018-06-22 LAB — TROPONIN I: Troponin I: <0.03 ng/mL (ref <0.03)

## 2018-06-22 LAB — CBC
HCT: 46 % (ref 36.0–46.0)
Hemoglobin: 14.9 g/dL (ref 12.0–15.0)
MCH: 29.2 pg (ref 26.0–34.0)
MCHC: 32.4 g/dL (ref 30.0–36.0)
MCV: 90.2 fL (ref 80.0–100.0)
Platelets: 215 10*3/uL (ref 150–400)
RBC: 5.1 MIL/uL (ref 3.87–5.11)
RDW: 13.5 % (ref 11.5–15.5)
WBC: 6.7 10*3/uL (ref 4.0–10.5)
nRBC: 0 % (ref 0.0–0.2)

## 2018-06-22 LAB — BASIC METABOLIC PANEL
Anion gap: 10 (ref 5–15)
BUN: 19 mg/dL (ref 6–20)
CO2: 22 mmol/L (ref 22–32)
Calcium: 9.2 mg/dL (ref 8.9–10.3)
Chloride: 109 mmol/L (ref 98–111)
Creatinine, Ser: 0.79 mg/dL (ref 0.44–1.00)
GFR calc Af Amer: >60 mL/min (ref >60)
GFR calc non Af Amer: >60 mL/min (ref >60)
Glucose, Bld: 135 mg/dL — ABNORMAL HIGH (ref 70–99)
Potassium: 3.5 mmol/L (ref 3.5–5.1)
Sodium: 141 mmol/L (ref 135–145)

## 2018-06-22 NOTE — ED Triage Notes (Signed)
Patient reports she has been checking her blood pressure at home and it was elevated and just not feeling well.  Patient reports left shoulder pain.  Patient with hx of MI, stroke and CHF.

## 2018-06-22 NOTE — ED Notes (Signed)
Patient called for a room times three with no answer. 

## 2018-06-25 ENCOUNTER — Telehealth: Payer: Self-pay | Admitting: Emergency Medicine

## 2018-06-25 NOTE — Telephone Encounter (Addendum)
Called patient due to lwot to inquire about condition and follow up plans.  Voicemail was full.   She called me back.  I told her to return anytime.  She does have appt with pcp but next week.  She feels it is her congestive heart failure and is out of fluid pills.

## 2018-06-28 ENCOUNTER — Encounter: Payer: Self-pay | Admitting: Cardiology

## 2018-07-16 ENCOUNTER — Telehealth: Payer: Self-pay

## 2018-07-16 NOTE — Telephone Encounter (Signed)
Call attempted to schedule a new patient appointment for patient per referral request from [facility]. Left v/m message requesting for patient to call back to schedule.

## 2018-08-14 ENCOUNTER — Telehealth: Payer: Self-pay | Admitting: Cardiology

## 2018-08-14 NOTE — Telephone Encounter (Signed)

## 2018-08-15 ENCOUNTER — Other Ambulatory Visit: Payer: Self-pay | Admitting: *Deleted

## 2018-08-15 ENCOUNTER — Encounter: Payer: Self-pay | Admitting: Cardiology

## 2018-08-15 ENCOUNTER — Ambulatory Visit (INDEPENDENT_AMBULATORY_CARE_PROVIDER_SITE_OTHER): Payer: PRIVATE HEALTH INSURANCE | Admitting: Urology

## 2018-08-15 ENCOUNTER — Ambulatory Visit (INDEPENDENT_AMBULATORY_CARE_PROVIDER_SITE_OTHER): Payer: PRIVATE HEALTH INSURANCE | Admitting: Cardiology

## 2018-08-15 ENCOUNTER — Encounter: Payer: Self-pay | Admitting: Urology

## 2018-08-15 ENCOUNTER — Other Ambulatory Visit: Payer: Self-pay

## 2018-08-15 VITALS — BP 133/81 | HR 97 | Ht 62.0 in | Wt 245.0 lb

## 2018-08-15 VITALS — BP 98/78 | HR 72 | Ht 62.0 in | Wt 245.2 lb

## 2018-08-15 DIAGNOSIS — R600 Localized edema: Secondary | ICD-10-CM

## 2018-08-15 DIAGNOSIS — Z7189 Other specified counseling: Secondary | ICD-10-CM

## 2018-08-15 DIAGNOSIS — R0789 Other chest pain: Secondary | ICD-10-CM | POA: Diagnosis not present

## 2018-08-15 DIAGNOSIS — E7800 Pure hypercholesterolemia, unspecified: Secondary | ICD-10-CM

## 2018-08-15 DIAGNOSIS — R079 Chest pain, unspecified: Secondary | ICD-10-CM | POA: Insufficient documentation

## 2018-08-15 DIAGNOSIS — R310 Gross hematuria: Secondary | ICD-10-CM

## 2018-08-15 DIAGNOSIS — R0602 Shortness of breath: Secondary | ICD-10-CM | POA: Insufficient documentation

## 2018-08-15 DIAGNOSIS — Z6841 Body Mass Index (BMI) 40.0 and over, adult: Secondary | ICD-10-CM

## 2018-08-15 DIAGNOSIS — Z8673 Personal history of transient ischemic attack (TIA), and cerebral infarction without residual deficits: Secondary | ICD-10-CM | POA: Insufficient documentation

## 2018-08-15 DIAGNOSIS — R34 Anuria and oliguria: Secondary | ICD-10-CM

## 2018-08-15 DIAGNOSIS — R002 Palpitations: Secondary | ICD-10-CM

## 2018-08-15 DIAGNOSIS — Z01810 Encounter for preprocedural cardiovascular examination: Secondary | ICD-10-CM | POA: Diagnosis not present

## 2018-08-15 LAB — URINALYSIS, COMPLETE
Bilirubin, UA: NEGATIVE
Glucose, UA: NEGATIVE
Ketones, UA: NEGATIVE
Leukocytes,UA: NEGATIVE
Nitrite, UA: POSITIVE — AB
Protein,UA: NEGATIVE
Specific Gravity, UA: 1.025 (ref 1.005–1.030)
Urobilinogen, Ur: 0.2 mg/dL (ref 0.2–1.0)
pH, UA: 5 (ref 5.0–7.5)

## 2018-08-15 LAB — MICROSCOPIC EXAMINATION: RBC, Urine: NONE SEEN /hpf (ref 0–2)

## 2018-08-15 LAB — BLADDER SCAN AMB NON-IMAGING

## 2018-08-15 NOTE — Patient Instructions (Addendum)
Medication Instructions:   Your physician recommends that you continue on your current medications as directed. Please refer to the Current Medication list given to you today. If you need a refill on your cardiac medications before your next appointment, please call your pharmacy.   Lab work:  Your physician recommends that you get a BMET and CBC at your urology appointment this afternoon.   COVID testing - You will need to go to the [facility] thru COVID testing location. You may go anytime between 08am to 3 pm. You will need to wear a mask. You will be required to self-quarantine from the time of testing until your procedure.   If you have labs (blood work) drawn today and your tests are completely normal, you will receive your results only by: Marland Kitchen. MyChart Message (if you have MyChart) OR . A paper copy in the mail If you have any lab test that is abnormal or we need to change your treatment, we will call you to review the results.  Testing/Procedures:  Your physician has requested that you have an Echocardiogram. Echocardiography is a painless test that uses sound waves to create images of your heart. It provides your doctor with information about the size and shape of your heart and how well your heart's chambers and valves are working. This procedure takes approximately one hour. There are no restrictions for this procedure. You may received an ultrasound enhancing agent called Definity through an IV if the technologist feels this is necessary.    Your physician has requested that you have a Left cardiac catheterization. Cardiac catheterization is used to diagnose and/or treat various heart conditions. Doctors may recommend this procedure for a number of different reasons. The most common reason is to evaluate chest pain. Chest pain can be a symptom of coronary artery disease (CAD), and cardiac catheterization can show whether plaque is narrowing or blocking your heart's  arteries. This procedure is also used to evaluate the valves, as well as measure the blood flow and oxygen levels in different parts of your heart. For further information please visit [email or web address]. Please follow instruction sheet, as given.  [facility] Cardiac Cath Instructions   You are scheduled for a Cardiac Cath on:__08/20/2020_______  Please arrive at _6:30__am on the day of your procedure  Please expect a call from our [facility] to pre-register you  Do not eat/drink anything after midnight  Someone will need to drive you home  It is recommended someone be with you for the first 24 hours after your procedure  Wear clothes that are easy to get on/off and wear slip on shoes if possible   Medications bring a current list of all medications with you  _X__ You may take all of your medications the morning of your procedure with enough water to swallow safely   Day of your procedure: Arrive at the [facility] entrance.  Free valet service is available.  After entering the [facility] please check-in at the registration desk (1st desk on your right) to receive your armband. After receiving your armband someone will escort you to the cardiac cath/special procedures waiting area.  The usual length of stay after your procedure is about 2 to 3 hours.  This can vary.  If you have any questions, please call our office at [phone], or you may call the cardiac cath lab at [facility] directly at [phone]     Follow-Up: At [facility], you and your health needs are our  priority.  As part of our continuing mission to provide you with exceptional heart care, we have created designated Provider Care Teams.  These Care Teams include your primary Cardiologist (physician) and Advanced Practice Providers (APPs -  Physician Assistants and Nurse Practitioners) who all work together to provide you with the care you need, when you need it. You will need a follow up  appointment in 1 -2 weeks with Dr. Harrell Gave or Dr. Saunders Revel (who is preforming Cath) .    You may see one of the following Advanced Practice Providers on your designated Care Team:   Murray Hodgkins, NP Christell Faith, PA-C . Marrianne Mood, PA-C   Coronary Angiogram A coronary angiogram is an X-ray procedure that is used to examine the arteries in the heart. In this procedure, a dye (contrast dye) is injected through a long, thin tube (catheter). The catheter is inserted through the groin, wrist, or arm. The dye is injected into each artery, then X-rays are taken to show if there is a blockage in the arteries of the heart. This procedure can also show if you have valve disease or a disease of the aorta, and it can be used to check the overall function of your heart muscle. You may have a coronary angiogram if:  You are having chest pain, or other symptoms of angina, and you are at risk for heart disease.  You have an abnormal electrocardiogram (ECG) or stress test.  You have chest pain and heart failure.  You are having irregular heart rhythms.  You and your health care provider determine that the benefits of the test information outweigh the risks of the procedure. Let your health care provider know about:  Any allergies you have, including allergies to contrast dye.  All medicines you are taking, including vitamins, herbs, eye drops, creams, and over-the-counter medicines.  Any problems you or family members have had with anesthetic medicines.  Any blood disorders you have.  Any surgeries you have had.  History of kidney problems or kidney failure.  Any medical conditions you have.  Whether you are pregnant or may be pregnant. What are the risks? Generally, this is a safe procedure. However, problems may occur, including:  Infection.  Allergic reaction to medicines or dyes that are used.  Bleeding from the access site or other locations.  Kidney injury, especially in  people with impaired kidney function.  Stroke (rare).  Heart attack (rare).  Damage to other structures or organs. What happens before the procedure? Staying hydrated Follow instructions from your health care provider about hydration, which may include:  Up to 2 hours before the procedure - you may continue to drink clear liquids, such as water, clear fruit juice, black coffee, and plain tea. Eating and drinking restrictions Follow instructions from your health care provider about eating and drinking, which may include:  8 hours before the procedure - stop eating heavy meals or foods such as meat, fried foods, or fatty foods.  6 hours before the procedure - stop eating light meals or foods, such as toast or cereal.  2 hours before the procedure - stop drinking clear liquids. General instructions  Ask your health care provider about: ? Changing or stopping your regular medicines. This is especially important if you are taking diabetes medicines or blood thinners. ? Taking medicines such as ibuprofen. These medicines can thin your blood. Do not take these medicines before your procedure if your health care provider instructs you not to, though aspirin may be  recommended prior to coronary angiograms.  Plan to have someone take you home from the hospital or clinic.  You may need to have blood tests or X-rays done. What happens during the procedure?  An IV tube will be inserted into one of your veins.  You will be given one or more of the following: ? A medicine to help you relax (sedative). ? A medicine to numb the area where the catheter will be inserted into an artery (local anesthetic).  To reduce your risk of infection: ? Your health care team will wash or sanitize their hands. ? Your skin will be washed with soap. ? Hair may be removed from the area where the catheter will be inserted.  You will be connected to a continuous ECG monitor.  The catheter will be inserted into  an artery. The location may be in your groin, in your wrist, or in the fold of your arm (near your elbow).  A type of X-ray (fluoroscopy) will be used to help guide the catheter to the opening of the blood vessel that is being examined.  A dye will be injected into the catheter, and X-rays will be taken. The dye will help to show where any narrowing or blockages are located in the heart arteries.  Tell your health care provider if you have any chest pain or trouble breathing during the procedure.  If blockages are found, your health care provider may perform another procedure, such as inserting a coronary stent. The procedure may vary among health care providers and hospitals. What happens after the procedure?  After the procedure, you will need to keep the area still for a few hours, or for as long as told by your health care provider. If the procedure is done through the groin, you will be instructed to not bend and not cross your legs.  The insertion site will be checked frequently.  The pulse in your foot or wrist will be checked frequently.  You may have additional blood tests, X-rays, and a test that records the electrical activity of your heart (ECG).  Do not drive for 24 hours if you were given a sedative. Summary  A coronary angiogram is an X-ray procedure that is used to look into the arteries in the heart.  During the procedure, a dye (contrast dye) is injected through a long, thin tube (catheter). The catheter is inserted through the groin, wrist, or arm.  Tell your health care provider about any allergies you have, including allergies to contrast dye.  After the procedure, you will need to keep the area still for a few hours, or for as long as told by your health care provider. This information is not intended to replace advice given to you by your health care provider. Make sure you discuss any questions you have with your health care provider. Document Released:  07/10/2002 Document Revised: 12/16/2016 Document Reviewed: 10/16/2015 Elsevier Patient Education  2020 [employer]

## 2018-08-15 NOTE — Patient Instructions (Signed)
Hematuria, Adult Hematuria is blood in the urine. Blood may be visible in the urine, or it may be identified with a test. This condition can be caused by infections of the bladder, urethra, kidney, or prostate. Other possible causes include:  Kidney stones.  Cancer of the urinary tract.  Too much calcium in the urine.  Conditions that are passed from parent to child (inherited conditions).  Exercise that requires a lot of energy. Infections can usually be treated with medicine, and a kidney stone usually will pass through your urine. If neither of these is the cause of your hematuria, more tests may be needed to identify the cause of your symptoms. It is very important to tell your health care provider about any blood in your urine, even if it is painless or the blood stops without treatment. Blood in the urine, when it happens and then stops and then happens again, can be a symptom of a very serious condition, including cancer. There is no pain in the initial stages of many urinary cancers. Follow these instructions at home: Medicines  Take over-the-counter and prescription medicines only as told by your health care provider.  If you were prescribed an antibiotic medicine, take it as told by your health care provider. Do not stop taking the antibiotic even if you start to feel better. Eating and drinking  Drink enough fluid to keep your urine clear or pale yellow. It is recommended that you drink 3-4 quarts (2.8-3.8 L) a day. If you have been diagnosed with an infection, it is recommended that you drink cranberry juice in addition to large amounts of water.  Avoid caffeine, tea, and carbonated beverages. These tend to irritate the bladder.  Avoid alcohol because it may irritate the prostate (men). General instructions  If you have been diagnosed with a kidney stone, follow your health care provider's instructions about straining your urine to catch the stone.  Empty your bladder  often. Avoid holding urine for long periods of time.  If you are female: ? After a bowel movement, wipe from front to back and use each piece of toilet paper only once. ? Empty your bladder before and after sex.  Pay attention to any changes in your symptoms. Tell your health care provider about any changes or any new symptoms.  It is your responsibility to get your test results. Ask your health care provider, or the department performing the test, when your results will be ready.  Keep all follow-up visits as told by your health care provider. This is important. Contact a health care provider if:  You develop back pain.  You have a fever.  You have nausea or vomiting.  Your symptoms do not improve after 3 days.  Your symptoms get worse. Get help right away if:  You develop severe vomiting and are unable take medicine without vomiting.  You develop severe pain in your back or abdomen even though you are taking medicine.  You pass a large amount of blood in your urine.  You pass blood clots in your urine.  You feel very weak or like you might faint.  You faint. Summary  Hematuria is blood in the urine. It has many possible causes.  It is very important that you tell your health care provider about any blood in your urine, even if it is painless or the blood stops without treatment.  Take over-the-counter and prescription medicines only as told by your health care provider.  Drink enough fluid to keep   your urine clear or pale yellow. This information is not intended to replace advice given to you by your health care provider. Make sure you discuss any questions you have with your health care provider. Document Released: 01/03/2005 Document Revised: 12/16/2016 Document Reviewed: 02/06/2016 [employer] Patient Education  2020 [employer]  

## 2018-08-15 NOTE — H&P (View-Only) (Signed)
Cardiology Office Note:    Date:  08/15/2018   ID:  Kelly Horton, DOB Nov 20, 1975, MRN [id number]  PCP:  [facility]  Cardiologist:  Jodelle Red, MD PhD  Referring MD: Lorn Junes, FNP   Chief Complaint  Patient presents with  . other    CHF/CAD/CVA/TACH c/o swelling hands/legs, chest heaviness and rapid heart beats. Meds reviewed verbally with pt.    History of Present Illness:    Kelly Horton is a 43 y.o. female with a hx of CVA, possible prior MI (no cath) who is seen as a new consult at the request of Lorn Junes, FNP for the evaluation and management of history of CVA, concern for tachycardia/possible heart failure.  I reviewed uploaded notes from PCP (under media tab). I otherwise do not have prior records.  Her main concerns today are that she feels horrible. Legs hurt, no energy.  Cardiovascular risk factors: Prior clinical ASCVD: Yes, as below: -Had CVA in 2003, told it was stress related. Took 2 years to recover. Post CVA has intermittent complex migraines with strokelike symptoms. Had her son in 05/2001, had stroke in 08/2001 -Told she had MI in 2018 in [place], it was either [place] or [place]. [place]. No records in Care Everywhere. Never had heart cath. Told she might have HF. Reports no echo done. Only blood work. Didn't see cardiologist at all  Reports never having seen cardiologist ever before.  Comorbid conditions: COPD, DJD, neuropathy, HLD. No diabetes, HTN. Metabolic syndrome/Obesity: BMI 44.86, hyperlipidemia Chronic inflammatory conditions: none known Tobacco use history: quit smoking 02/01/18, was 2 ppd for about 30 years. Family history: mother had mitral valve prolapse, had 4 Mis (first when she was in her early 23s, last in her late 48s). Died age 19 (had breast cancer, lung cancer, COPD). Father generally healthy, had HTN, died of lung and prostate cancer at age 39. Mat Gma had 4V CABG, mat uncle also  had CABG. Brother had 3 MI (first in early 51s), died at age 34 from heart failure. Prior cardiac testing and/or incidental findings on other testing (ie coronary calcium): Exercise level: tries to walk, but legs go numb, get swollen, and she has mechanical falls. Current diet: eats a lot of meat. Used to fry, now changing to baking/grilling. Few grains/vegetables.  CV symptoms: Chest pain: sometimes left shoulder pain, stabbing, and then like elephant sitting on chest. Worse when she lies flat. Sleeps on 4 pillows to avoid this. Also hurts during sexual intercourse that makes her stop. Also has similar symptoms during the day, sometimes with exertion and sometimes at rest. Feels "yucky" when this happens. Gets pale, short of breath. No clear nausea/diaphoresis. Better if she rests. Not sure how long it stays. Happens at least 3-4 times/week.Has been going on for 3-4 years.  LE edema: has been ongoing for about 2-3 years. Better when she soaks in epsom salt bath. Better with elevation. Worse when she has been on her feet all day. Not improved with compression stockings. Gets cramps in her legs when she walks, but she has some level of cramping 24/7. No nonhealing wounds, no rest pain/discoloration.  Has separate issues with muscle cramps, which can occur in her rib cage.  Palpitations: races, feels like she ran a marathon. Gets lightheaded. Happens 3-4 times/month. Has to stop and rest so she avoid passing out. No full syncope. Can last 2-3 minutes.  Pertinent labs from 06/28/18: Tchol 170, TG 147, HDL, LDL 91 A1c  5.4 LFTs nl CBC nl BMET nl except for Na 147, Cl 107.  Past Medical History:  Diagnosis Date  . COPD (chronic obstructive pulmonary disease) (HCC)   . MI (myocardial infarction) (HCC)    2008  . Murmur   . Stroke [facility](HCC)     Past surgical history: bladder reconstruction, hysterectomy, cyst removal  Current Medications: Current Outpatient Medications on File Prior to Visit   Medication Sig  . acetaminophen (TYLENOL) 500 MG tablet Take 1,500 mg by mouth every 6 (six) hours as needed. For headaches  . aspirin EC 81 MG tablet Take 81 mg by mouth daily.  Marland Kitchen. atorvastatin (LIPITOR) 40 MG tablet daily.  . carvedilol (COREG) 6.25 MG tablet 2 (two) times a day.  . escitalopram (LEXAPRO) 20 MG tablet daily.  . furosemide (LASIX) 20 MG tablet daily.  Marland Kitchen. gabapentin (NEURONTIN) 100 MG capsule daily.  Marland Kitchen. SPIRIVA HANDIHALER 18 MCG inhalation capsule as needed.   No current facility-administered medications on file prior to visit.      Allergies:   Mushroom extract complex, Penicillins, and Shellfish allergy   Social History   Socioeconomic History  . Marital status: Single    Spouse name: Not on file  . Number of children: Not on file  . Years of education: Not on file  . Highest education level: Not on file  Occupational History  . Not on file  Social Needs  . Financial resource strain: Not on file  . Food insecurity    Worry: Not on file    Inability: Not on file  . Transportation needs    Medical: Not on file    Non-medical: Not on file  Tobacco Use  . Smoking status: Former Smoker    Packs/day: 2.00    Years: 30.00    Pack years: 60.00    Types: Cigarettes  . Smokeless tobacco: Never Used  Substance and Sexual Activity  . Alcohol use: No    Alcohol/week: 0.0 standard drinks  . Drug use: Not Currently    Types: Marijuana  . Sexual activity: Not on file  Lifestyle  . Physical activity    Days per week: Not on file    Minutes per session: Not on file  . Stress: Not on file  Relationships  . Social Musicianconnections    Talks on phone: Not on file    Gets together: Not on file    Attends religious service: Not on file    Active member of club or organization: Not on file    Attends meetings of clubs or organizations: Not on file    Relationship status: Not on file  Other Topics Concern  . Not on file  Social History Narrative  . Not on file      Family History: The patient's family history includes Cancer in her father; Diabetes in her mother; Emphysema in her father and mother; Hypertension in her father; Mitral valve prolapse in her mother.  ROS:   Please see the history of present illness.  Additional pertinent ROS:  Constitutional: Negative for chills, fever, night sweats, unintentional weight loss  HENT: Negative for ear pain and hearing loss.   Eyes: Negative for loss of vision and eye pain.  Respiratory: Negative for cough, sputum, wheezing.   Cardiovascular: See HPI. Gastrointestinal: Negative for abdominal pain, melena, and hematochezia.  Genitourinary: Negative for dysuria. Positive for hematuria.  Musculoskeletal: Positive for falls and myalgias.  Skin: Negative for itching and rash.  Neurological: Positive for focal weakness,  focal sensory changes. Negative for loss of consciousness.  Endo/Heme/Allergies: Does bruise/bleed easily.    EKGs/Labs/Other Studies Reviewed:    The following studies were reviewed today: No prior cardiac studies  EKG:  EKG is personally reviewed.  The ekg ordered today demonstrates NSR, LAD, borderline R wave progression  Recent Labs: 02/07/2018: ALT 131; Magnesium 2.0 06/22/2018: BUN 19; Creatinine, Ser 0.79; Hemoglobin 14.9; Platelets 215; Potassium 3.5; Sodium 141  Recent Lipid Panel No results found for: CHOL, TRIG, HDL, CHOLHDL, VLDL, LDLCALC, LDLDIRECT  Physical Exam:    VS:  BP 98/78 (BP Location: Left Arm, Patient Position: Sitting, Cuff Size: Large)   Pulse 72   Ht 5\' 2"  (1.575 m)   Wt 245 lb 4 oz (111.2 kg)   BMI 44.86 kg/m     Wt Readings from Last 3 Encounters:  08/15/18 245 lb 4 oz (111.2 kg)  06/22/18 255 lb (115.7 kg)  05/11/18 245 lb (111.1 kg)     GEN: Well nourished, well developed in no acute distress HEENT: Normal NECK: No JVD; No carotid bruits LYMPHATICS: No lymphadenopathy CARDIAC: regular rhythm, normal S1 and S2, no murmurs, rubs, gallops. Radial  pulses 2+ bilaterally. DP pulse 2+ on left, 1+ on right. RESPIRATORY:  Clear to auscultation without rales, wheezing or rhonchi  ABDOMEN: Soft, non-tender, non-distended MUSCULOSKELETAL:  Bilateral 1+ LE edema. SKIN: Warm and dry NEUROLOGIC:  Alert and oriented x 3 PSYCHIATRIC:  Normal affect   ASSESSMENT:    1. Chest pain of uncertain etiology   2. SOB (shortness of breath)   3. Lower extremity edema   4. Pre-procedural cardiovascular examination   5. History of CVA (cerebrovascular accident)   6. Class 3 severe obesity due to excess calories with serious comorbidity and body mass index (BMI) of 40.0 to 44.9 in adult [facility](HCC)   7. Cardiac risk counseling   8. Counseling on health promotion and disease prevention   9. Heart palpitations   10. Pure hypercholesterolemia    PLAN:    Chest pain, shortness of breath, LE edema: has significant risk factors of prior ASCVD (CVA), hyperlipidemia, obesity, former tobacco abuse, very strong and premature family history -episodes have both typical and atypical components. We discussed noninvasive workup for CAD vs. Cath. Given her high risk and high suspicion, we both agreed that cath for definitive diagnosis is preferred. We reviewed cath procedure at length.   Risks and benefits of cardiac catheterization have been discussed with the patient.  These include bleeding, infection, kidney damage, stroke, heart attack, death.  The patient understands these risks and is willing to proceed.  Will need BMET, CBC (has urology f/u this PM and will have drawn then). Will also need COVID test prior.  -with shortness of breath, LE edema, will pursue echocardiogram as well. If there is suggestion of elevated right heart pressures/abnormal RV function or size, would also add right heart cath to procedure -already on furosemide 20 mg daily, carvedilol. BP borderline today. Based on results of echo, may need to adjust therapy.  Palpitations: only occurring 3-4  times/mos. No high risk features such as syncope. If becomes more frequent/severe, consider monitor.  History of CVA: needs secondary prevention. She is uncertain of cause/etiology -on aspirin 81 mg daily, atorvastatin 40 mg daily. See below re: LDL goal  Hypercholesterolemia: LDL on recent PCP notes 91, with ASCVD goal <70. On atorvastatin 40 mg, high intensity statin. Has muscle/leg weakness at baseline from prior stroke. Tolerating this, but I would like to intensify  to reach her LDL goal. -if there is any CAD on cath, would intensify dose of atorvastatin/change to rosuvastatin to reach goal  Cardiac risk counseling and secondary prevention recommendations: -recommend heart healthy/Mediterranean diet, with whole grains, fruits, vegetable, fish, lean meats, nuts, and olive oil. Limit salt. -recommend moderate walking, 3-5 times/week for 30-50 minutes each session. Aim for at least 150 minutes.week. Goal should be pace of 3 miles/hours, or walking 1.5 miles in 30 minutes -recommend avoidance of tobacco products. Avoid excess alcohol. -Additional risk factor control:  -Diabetes: A1c is 5.4, denies history of diabetes  -Lipids: as above  -Blood pressure control: normal to mildly (asymptomatic) hypotensive  -Weight: BMI 44.86, counseled on weight loss.  Plan for follow up: cath on 09/06/18. As I am primarily in the [place] office, she is welcome to come to that office to follow up with me, or she can follow up with one of my partners in the [facility] office post cath.  Medication Adjustments/Labs and Tests Ordered: Current medicines are reviewed at length with the patient today.  Concerns regarding medicines are outlined above.  Orders Placed This Encounter  Procedures  . Novel Coronavirus, NAA ([facility])  . Basic Metabolic Panel (BMET)  . CBC  . EKG 12-Lead  . ECHOCARDIOGRAM COMPLETE   No orders of the defined types were placed in this encounter.   Patient Instructions  Medication  Instructions:   Your physician recommends that you continue on your current medications as directed. Please refer to the Current Medication list given to you today. If you need a refill on your cardiac medications before your next appointment, please call your pharmacy.   Lab work:  Your physician recommends that you get a BMET and CBC at your urology appointment this afternoon.   COVID testing - You will need to go to the [facility] drive thru COVID testing location. You may go anytime between 08am to 3 pm. You will need to wear a mask. You will be required to self-quarantine from the time of testing until your procedure.   If you have labs (blood work) drawn today and your tests are completely normal, you will receive your results only by: Marland Kitchen MyChart Message (if you have MyChart) OR . A paper copy in the mail If you have any lab test that is abnormal or we need to change your treatment, we will call you to review the results.  Testing/Procedures:  Your physician has requested that you have an Echocardiogram. Echocardiography is a painless test that uses sound waves to create images of your heart. It provides your doctor with information about the size and shape of your heart and how well your heart's chambers and valves are working. This procedure takes approximately one hour. There are no restrictions for this procedure. You may received an ultrasound enhancing agent called Definity through an IV if the technologist feels this is necessary.    Your physician has requested that you have a Left cardiac catheterization. Cardiac catheterization is used to diagnose and/or treat various heart conditions. Doctors may recommend this procedure for a number of different reasons. The most common reason is to evaluate chest pain. Chest pain can be a symptom of coronary artery disease (CAD), and cardiac catheterization can show whether plaque is narrowing or blocking your heart's arteries. This  procedure is also used to evaluate the valves, as well as measure the blood flow and oxygen levels in different parts of your heart. For further information please visit [email or web address] Please  follow instruction sheet, as given.  [facility] Cardiac Cath Instructions   You are scheduled for a Cardiac Cath on:__08/20/2020_______  Please arrive at _6:30__am on the day of your procedure  Please expect a call from our [facility] to pre-register you  Do not eat/drink anything after midnight  Someone will need to drive you home  It is recommended someone be with you for the first 24 hours after your procedure  Wear clothes that are easy to get on/off and wear slip on shoes if possible   Medications bring a current list of all medications with you  _X__ You may take all of your medications the morning of your procedure with enough water to swallow safely   Day of your procedure: Arrive at the [facility] entrance.  Free valet service is available.  After entering the [facility] please check-in at the registration desk (1st desk on your right) to receive your armband. After receiving your armband someone will escort you to the cardiac cath/special procedures waiting area.  The usual length of stay after your procedure is about 2 to 3 hours.  This can vary.  If you have any questions, please call our office at [phone], or you may call the cardiac cath lab at [facility] directly at [phone]     Follow-Up: At [facility], you and your health needs are our priority.  As part of our continuing mission to provide you with exceptional heart care, we have created designated Provider Care Teams.  These Care Teams include your primary Cardiologist (physician) and Advanced Practice Providers (APPs -  Physician Assistants and Nurse Practitioners) who all work together to provide you with the care you need, when you need it. You will need a follow up appointment in 1 -2  weeks with Dr. Cristal Deerhristopher or Dr. Okey DupreEnd (who is preforming Cath) .    You may see one of the following Advanced Practice Providers on your designated Care Team:   Nicolasa Duckinghristopher Berge, NP Eula Listenyan Dunn, PA-C . Marisue IvanJacquelyn Visser, PA-C   Coronary Angiogram A coronary angiogram is an X-ray procedure that is used to examine the arteries in the heart. In this procedure, a dye (contrast dye) is injected through a long, thin tube (catheter). The catheter is inserted through the groin, wrist, or arm. The dye is injected into each artery, then X-rays are taken to show if there is a blockage in the arteries of the heart. This procedure can also show if you have valve disease or a disease of the aorta, and it can be used to check the overall function of your heart muscle. You may have a coronary angiogram if:  You are having chest pain, or other symptoms of angina, and you are at risk for heart disease.  You have an abnormal electrocardiogram (ECG) or stress test.  You have chest pain and heart failure.  You are having irregular heart rhythms.  You and your health care provider determine that the benefits of the test information outweigh the risks of the procedure. Let your health care provider know about:  Any allergies you have, including allergies to contrast dye.  All medicines you are taking, including vitamins, herbs, eye drops, creams, and over-the-counter medicines.  Any problems you or family members have had with anesthetic medicines.  Any blood disorders you have.  Any surgeries you have had.  History of kidney problems or kidney failure.  Any medical conditions you have.  Whether you are pregnant or may be pregnant.  What are the risks? Generally, this is a safe procedure. However, problems may occur, including:  Infection.  Allergic reaction to medicines or dyes that are used.  Bleeding from the access site or other locations.  Kidney injury, especially in people with impaired  kidney function.  Stroke (rare).  Heart attack (rare).  Damage to other structures or organs. What happens before the procedure? Staying hydrated Follow instructions from your health care provider about hydration, which may include:  Up to 2 hours before the procedure - you may continue to drink clear liquids, such as water, clear fruit juice, black coffee, and plain tea. Eating and drinking restrictions Follow instructions from your health care provider about eating and drinking, which may include:  8 hours before the procedure - stop eating heavy meals or foods such as meat, fried foods, or fatty foods.  6 hours before the procedure - stop eating light meals or foods, such as toast or cereal.  2 hours before the procedure - stop drinking clear liquids. General instructions  Ask your health care provider about: ? Changing or stopping your regular medicines. This is especially important if you are taking diabetes medicines or blood thinners. ? Taking medicines such as ibuprofen. These medicines can thin your blood. Do not take these medicines before your procedure if your health care provider instructs you not to, though aspirin may be recommended prior to coronary angiograms.  Plan to have someone take you home from the hospital or clinic.  You may need to have blood tests or X-rays done. What happens during the procedure?  An IV tube will be inserted into one of your veins.  You will be given one or more of the following: ? A medicine to help you relax (sedative). ? A medicine to numb the area where the catheter will be inserted into an artery (local anesthetic).  To reduce your risk of infection: ? Your health care team will wash or sanitize their hands. ? Your skin will be washed with soap. ? Hair may be removed from the area where the catheter will be inserted.  You will be connected to a continuous ECG monitor.  The catheter will be inserted into an artery. The  location may be in your groin, in your wrist, or in the fold of your arm (near your elbow).  A type of X-ray (fluoroscopy) will be used to help guide the catheter to the opening of the blood vessel that is being examined.  A dye will be injected into the catheter, and X-rays will be taken. The dye will help to show where any narrowing or blockages are located in the heart arteries.  Tell your health care provider if you have any chest pain or trouble breathing during the procedure.  If blockages are found, your health care provider may perform another procedure, such as inserting a coronary stent. The procedure may vary among health care providers and hospitals. What happens after the procedure?  After the procedure, you will need to keep the area still for a few hours, or for as long as told by your health care provider. If the procedure is done through the groin, you will be instructed to not bend and not cross your legs.  The insertion site will be checked frequently.  The pulse in your foot or wrist will be checked frequently.  You may have additional blood tests, X-rays, and a test that records the electrical activity of your heart (ECG).  Do not drive for  24 hours if you were given a sedative. Summary  A coronary angiogram is an X-ray procedure that is used to look into the arteries in the heart.  During the procedure, a dye (contrast dye) is injected through a long, thin tube (catheter). The catheter is inserted through the groin, wrist, or arm.  Tell your health care provider about any allergies you have, including allergies to contrast dye.  After the procedure, you will need to keep the area still for a few hours, or for as long as told by your health care provider. This information is not intended to replace advice given to you by your health care provider. Make sure you discuss any questions you have with your health care provider. Document Released: 07/10/2002 Document  Revised: 12/16/2016 Document Reviewed: 10/16/2015 Elsevier Patient Education  2020 [employer].      Signed, Jodelle Red, MD PhD 08/15/2018 11:06 AM    [facility]

## 2018-08-15 NOTE — Progress Notes (Signed)
08/15/2018 3:08 PM   Kelly Horton Kelly Horton 01-Jan-1976 [id number]  Referring provider: Center,  [facility] [address] [place] [id number]  No chief complaint on file.   HPI:  Kelly Horton was referred over for infrequent voiding. She voids about 2-3 times per day. She has an adequate flow. She doesn't feel empty. She leaks a little throughout the day. No pad use. Just moisture. No frequency or dysuria. No urgency. Her Cr was 0.79 and 0.8 on two separate occasions. She has noticed some blood in the urine a time or two. She has a history of hysterectomy in 2003 and what sounds like a bladder prolapse repair in 2006. She did all she could to get a urine specimen. PVR 10 ml . Her last UA showed trace blood, +LE, +N and she start NF, but pt denies dysuria.   PMH: Past Medical History:  Diagnosis Date  . COPD (chronic obstructive pulmonary disease) (HCC)   . MI (myocardial infarction) (HCC)    2008  . Murmur   . Stroke [facility](HCC)     Surgical History: No past surgical history on file.  Home Medications:  Allergies as of 08/15/2018      Reactions   Mushroom Extract Complex Anaphylaxis   Penicillins Hives   Shellfish Allergy Swelling      Medication List       Accurate as of August 15, 2018  3:08 PM. If you have any questions, ask your nurse or doctor.        STOP taking these medications   benzonatate 100 MG capsule Commonly known as: LawyerTessalon Perles Stopped by: Jodelle RedBridgette Christopher, MD   cyclobenzaprine 5 MG tablet Commonly known as: Flexeril Stopped by: Jodelle RedBridgette Christopher, MD   Free Spirit Knee/Leg Walker Misc Stopped by: Jodelle RedBridgette Christopher, MD   HYDROcodone-acetaminophen 5-325 MG tablet Commonly known as: Norco Stopped by: Jodelle RedBridgette Christopher, MD   hydrOXYzine 50 MG tablet Commonly known as: ATARAX/VISTARIL Stopped by: Jodelle RedBridgette Christopher, MD   ibuprofen 800 MG tablet Commonly known as: ADVIL Stopped by: Jodelle RedBridgette Christopher, MD    ketorolac 10 MG tablet Commonly known as: TORADOL Stopped by: Jodelle RedBridgette Christopher, MD   methocarbamol 500 MG tablet Commonly known as: ROBAXIN Stopped by: Jodelle RedBridgette Christopher, MD   methylPREDNISolone 4 MG Tbpk tablet Commonly known as: MEDROL DOSEPAK Stopped by: Jodelle RedBridgette Christopher, MD   naproxen 500 MG tablet Commonly known as: NAPROSYN Stopped by: Jodelle RedBridgette Christopher, MD   omeprazole 20 MG capsule Commonly known as: PriLOSEC Stopped by: Jodelle RedBridgette Christopher, MD   ondansetron 4 MG disintegrating tablet Commonly known as: Zofran ODT Stopped by: Jodelle RedBridgette Christopher, MD   predniSONE 10 MG tablet Commonly known as: DELTASONE Stopped by: Jodelle RedBridgette Christopher, MD   sucralfate 1 g tablet Commonly known as: Carafate Stopped by: Jodelle RedBridgette Christopher, MD     TAKE these medications   acetaminophen 500 MG tablet Commonly known as: TYLENOL Take 1,500 mg by mouth every 6 (six) hours as needed. For headaches   aspirin EC 81 MG tablet Take 81 mg by mouth daily.   atorvastatin 40 MG tablet Commonly known as: LIPITOR daily.   carvedilol 6.25 MG tablet Commonly known as: COREG 2 (two) times a day.   escitalopram 20 MG tablet Commonly known as: LEXAPRO daily.   furosemide 20 MG tablet Commonly known as: LASIX daily.   gabapentin 100 MG capsule Commonly known as: NEURONTIN daily.   Spiriva HandiHaler 18 MCG inhalation capsule Generic drug: tiotropium as needed.  Allergies:  Allergies  Allergen Reactions  . Mushroom Extract Complex Anaphylaxis  . Penicillins Hives  . Shellfish Allergy Swelling    Family History: Family History  Problem Relation Age of Onset  . Diabetes Mother   . Emphysema Mother   . Mitral valve prolapse Mother   . Hypertension Father   . Cancer Father   . Emphysema Father     Social History:  reports that she has quit smoking. Her smoking use included cigarettes. She has a 60.00 pack-year smoking history. She  has never used smokeless tobacco. She reports previous drug use. Drug: Marijuana. She reports that she does not drink alcohol.  ROS:                                        Physical Exam: There were no vitals taken for this visit.  Constitutional:  Alert and oriented, No acute distress. HEENT: [place] AT, moist mucus membranes.  Trachea midline, no masses. Cardiovascular: No clubbing, cyanosis, or edema. Respiratory: Normal respiratory effort, no increased work of breathing. GI: Abdomen is soft, nontender, nondistended, no abdominal masses GU: No CVA tenderness Lymph: No cervical or inguinal lymphadenopathy. Skin: No rashes, bruises or suspicious lesions. Neurologic: Grossly intact, no focal deficits, moving all 4 extremities. Psychiatric: Normal mood and affect.  Laboratory Data: Lab Results  Component Value Date   WBC 6.7 06/22/2018   HGB 14.9 06/22/2018   HCT 46.0 06/22/2018   MCV 90.2 06/22/2018   PLT 215 06/22/2018    Lab Results  Component Value Date   CREATININE 0.79 06/22/2018    No results found for: PSA  No results found for: TESTOSTERONE  No results found for: HGBA1C  Urinalysis    Component Value Date/Time   COLORURINE YELLOW (A) 09/10/2017 1804   APPEARANCEUR HAZY (A) 09/10/2017 1804   APPEARANCEUR Cloudy 07/24/2013 1028   LABSPEC 1.018 09/10/2017 1804   LABSPEC 1.027 07/24/2013 1028   PHURINE 5.0 09/10/2017 1804   GLUCOSEU NEGATIVE 09/10/2017 1804   GLUCOSEU Negative 07/24/2013 1028   HGBUR SMALL (A) 09/10/2017 1804   BILIRUBINUR NEGATIVE 09/10/2017 1804   BILIRUBINUR Negative 07/24/2013 1028   KETONESUR NEGATIVE 09/10/2017 1804   PROTEINUR NEGATIVE 09/10/2017 1804   NITRITE NEGATIVE 09/10/2017 1804   LEUKOCYTESUR NEGATIVE 09/10/2017 1804   LEUKOCYTESUR 2+ 07/24/2013 1028    Lab Results  Component Value Date   BACTERIA NONE SEEN 09/10/2017    Pertinent Imaging: n/a No results found for this or any previous visit. No  results found for this or any previous visit. No results found for this or any previous visit. No results found for this or any previous visit. Results for orders placed during the hospital encounter of 12/08/14  US Renal   Narrative CLINICAL DATA:  43 year old female with right flank pain  EXAM: RENAL / URINARY TRACT ULTRASOUND COMPLETE  COMPARISON:  Prior abdominal ultrasound 09/17/2008  FINDINGS: Right Kidney:  Length: 10.2. Echogenicity within normal limits. No mass or hydronephrosis visualized.  Left Kidney:  Length: 10.2. Echogenicity within normal limits. No mass or hydronephrosis visualized.  Bladder:  Appears normal for degree of bladder distention.  IMPRESSION: Unremarkable sonographic appearance of the kidneys.   Electronically Signed   By: Malachy MoanHeath  McCullough M.D.   On: 12/08/2014 18:38    No results found for this or any previous visit. No results found for this or any previous visit. No results  found for this or any previous visit.  Assessment & Plan:    Gross hematuria - check renal US and cystoscopy - discussed with patient and she elects to proceed.   Infrequent voiding, feeling of inc empty - check cystoscopy as above - pvr nl and Cr nl.   No follow-ups on file.  Festus Aloe, MD  [facility] [address] [place],  [address] [phone]

## 2018-08-15 NOTE — Progress Notes (Signed)
Cardiology Office Note:    Date:  08/15/2018   ID:  Kelly Horton, DOB 06/26/1975, MRN [id number]  PCP:  [facility]  Cardiologist:  Darthy Manganelli, MD PhD  Referring MD: Crawford, Anna E, FNP   Chief Complaint  Patient presents with  . other    CHF/CAD/CVA/TACH c/o swelling hands/legs, chest heaviness and rapid heart beats. Meds reviewed verbally with pt.    History of Present Illness:    Kelly Horton is a 43 y.o. female with a hx of CVA, possible prior MI (no cath) who is seen as a new consult at the request of Crawford, Anna E, FNP for the evaluation and management of history of CVA, concern for tachycardia/possible heart failure.  I reviewed uploaded notes from PCP (under media tab). I otherwise do not have prior records.  Her main concerns today are that she feels horrible. Legs hurt, no energy.  Cardiovascular risk factors: Prior clinical ASCVD: Yes, as below: -Had CVA in 2003, told it was stress related. Took 2 years to recover. Post CVA has intermittent complex migraines with strokelike symptoms. Had her son in 05/2001, had stroke in 08/2001 -Told she had MI in 2018 in [place], it was [facility]. No records in Care Everywhere. Never had heart cath. Told she might have HF. Reports no echo done. Only blood work. Didn't see cardiologist at all  Reports never having seen cardiologist ever before.  Comorbid conditions: COPD, DJD, neuropathy, HLD. No diabetes, HTN. Metabolic syndrome/Obesity: BMI 44.86, hyperlipidemia Chronic inflammatory conditions: none known Tobacco use history: quit smoking 02/01/18, was 2 ppd for about 30 years. Family history: mother had mitral valve prolapse, had 4 Mis (first when she was in her early 20s, last in her late 60s). Died age 70 (had breast cancer, lung cancer, COPD). Father generally healthy, had HTN, died of lung and prostate cancer at age 65. Mat Gma had 4V CABG, mat uncle also  had CABG. Brother had 3 MI (first in early 30s), died at age 54 from heart failure. Prior cardiac testing and/or incidental findings on other testing (ie coronary calcium): Exercise level: tries to walk, but legs go numb, get swollen, and she has mechanical falls. Current diet: eats a lot of meat. Used to fry, now changing to baking/grilling. Few grains/vegetables.  CV symptoms: Chest pain: sometimes left shoulder pain, stabbing, and then like elephant sitting on chest. Worse when she lies flat. Sleeps on 4 pillows to avoid this. Also hurts during sexual intercourse that makes her stop. Also has similar symptoms during the day, sometimes with exertion and sometimes at rest. Feels "yucky" when this happens. Gets pale, short of breath. No clear nausea/diaphoresis. Better if she rests. Not sure how long it stays. Happens at least 3-4 times/week.Has been going on for 3-4 years.  LE edema: has been ongoing for about 2-3 years. Better when she soaks in epsom salt bath. Better with elevation. Worse when she has been on her feet all day. Not improved with compression stockings. Gets cramps in her legs when she walks, but she has some level of cramping 24/7. No nonhealing wounds, no rest pain/discoloration.  Has separate issues with muscle cramps, which can occur in her rib cage.  Palpitations: races, feels like she ran a marathon. Gets lightheaded. Happens 3-4 times/month. Has to stop and rest so she avoid passing out. No full syncope. Can last 2-3 minutes.  Pertinent labs from 06/28/18: Tchol 170, TG 147, HDL, LDL 91 A1c   5.4 LFTs nl CBC nl BMET nl except for Na 147, Cl 107.  Past Medical History:  Diagnosis Date  . COPD (chronic obstructive pulmonary disease) (HCC)   . MI (myocardial infarction) (HCC)    2008  . Murmur   . Stroke (HCC)     Past surgical history: bladder reconstruction, hysterectomy, cyst removal  Current Medications: Current Outpatient Medications on File Prior to Visit   Medication Sig  . acetaminophen (TYLENOL) 500 MG tablet Take 1,500 mg by mouth every 6 (six) hours as needed. For headaches  . aspirin EC 81 MG tablet Take 81 mg by mouth daily.  . atorvastatin (LIPITOR) 40 MG tablet daily.  . carvedilol (COREG) 6.25 MG tablet 2 (two) times a day.  . escitalopram (LEXAPRO) 20 MG tablet daily.  . furosemide (LASIX) 20 MG tablet daily.  . gabapentin (NEURONTIN) 100 MG capsule daily.  . SPIRIVA HANDIHALER 18 MCG inhalation capsule as needed.   No current facility-administered medications on file prior to visit.      Allergies:   Mushroom extract complex, Penicillins, and Shellfish allergy   Social History   Socioeconomic History  . Marital status: Single    Spouse name: Not on file  . Number of children: Not on file  . Years of education: Not on file  . Highest education level: Not on file  Occupational History  . Not on file  Social Needs  . Financial resource strain: Not on file  . Food insecurity    Worry: Not on file    Inability: Not on file  . Transportation needs    Medical: Not on file    Non-medical: Not on file  Tobacco Use  . Smoking status: Former Smoker    Packs/day: 2.00    Years: 30.00    Pack years: 60.00    Types: Cigarettes  . Smokeless tobacco: Never Used  Substance and Sexual Activity  . Alcohol use: No    Alcohol/week: 0.0 standard drinks  . Drug use: Not Currently    Types: Marijuana  . Sexual activity: Not on file  Lifestyle  . Physical activity    Days per week: Not on file    Minutes per session: Not on file  . Stress: Not on file  Relationships  . Social connections    Talks on phone: Not on file    Gets together: Not on file    Attends religious service: Not on file    Active member of club or organization: Not on file    Attends meetings of clubs or organizations: Not on file    Relationship status: Not on file  Other Topics Concern  . Not on file  Social History Narrative  . Not on file      Family History: The patient's family history includes Cancer in her father; Diabetes in her mother; Emphysema in her father and mother; Hypertension in her father; Mitral valve prolapse in her mother.  ROS:   Please see the history of present illness.  Additional pertinent ROS:  Constitutional: Negative for chills, fever, night sweats, unintentional weight loss  HENT: Negative for ear pain and hearing loss.   Eyes: Negative for loss of vision and eye pain.  Respiratory: Negative for cough, sputum, wheezing.   Cardiovascular: See HPI. Gastrointestinal: Negative for abdominal pain, melena, and hematochezia.  Genitourinary: Negative for dysuria. Positive for hematuria.  Musculoskeletal: Positive for falls and myalgias.  Skin: Negative for itching and rash.  Neurological: Positive for focal weakness,   focal sensory changes. Negative for loss of consciousness.  Endo/Heme/Allergies: Does bruise/bleed easily.    EKGs/Labs/Other Studies Reviewed:    The following studies were reviewed today: No prior cardiac studies  EKG:  EKG is personally reviewed.  The ekg ordered today demonstrates NSR, LAD, borderline R wave progression  Recent Labs: 02/07/2018: ALT 131; Magnesium 2.0 06/22/2018: BUN 19; Creatinine, Ser 0.79; Hemoglobin 14.9; Platelets 215; Potassium 3.5; Sodium 141  Recent Lipid Panel No results found for: CHOL, TRIG, HDL, CHOLHDL, VLDL, LDLCALC, LDLDIRECT  Physical Exam:    VS:  BP 98/78 (BP Location: Left Arm, Patient Position: Sitting, Cuff Size: Large)   Pulse 72   Ht 5' 2" (1.575 m)   Wt 245 lb 4 oz (111.2 kg)   BMI 44.86 kg/m     Wt Readings from Last 3 Encounters:  08/15/18 245 lb 4 oz (111.2 kg)  06/22/18 255 lb (115.7 kg)  05/11/18 245 lb (111.1 kg)     GEN: Well nourished, well developed in no acute distress HEENT: Normal NECK: No JVD; No carotid bruits LYMPHATICS: No lymphadenopathy CARDIAC: regular rhythm, normal S1 and S2, no murmurs, rubs, gallops. Radial  pulses 2+ bilaterally. DP pulse 2+ on left, 1+ on right. RESPIRATORY:  Clear to auscultation without rales, wheezing or rhonchi  ABDOMEN: Soft, non-tender, non-distended MUSCULOSKELETAL:  Bilateral 1+ LE edema. SKIN: Warm and dry NEUROLOGIC:  Alert and oriented x 3 PSYCHIATRIC:  Normal affect   ASSESSMENT:    1. Chest pain of uncertain etiology   2. SOB (shortness of breath)   3. Lower extremity edema   4. Pre-procedural cardiovascular examination   5. History of CVA (cerebrovascular accident)   6. Class 3 severe obesity due to excess calories with serious comorbidity and body mass index (BMI) of 40.0 to 44.9 in adult (HCC)   7. Cardiac risk counseling   8. Counseling on health promotion and disease prevention   9. Heart palpitations   10. Pure hypercholesterolemia    PLAN:    Chest pain, shortness of breath, LE edema: has significant risk factors of prior ASCVD (CVA), hyperlipidemia, obesity, former tobacco abuse, very strong and premature family history -episodes have both typical and atypical components. We discussed noninvasive workup for CAD vs. Cath. Given her high risk and high suspicion, we both agreed that cath for definitive diagnosis is preferred. We reviewed cath procedure at length.   Risks and benefits of cardiac catheterization have been discussed with the patient.  These include bleeding, infection, kidney damage, stroke, heart attack, death.  The patient understands these risks and is willing to proceed.  Will need BMET, CBC (has urology f/u this PM and will have drawn then). Will also need COVID test prior.  -with shortness of breath, LE edema, will pursue echocardiogram as well. If there is suggestion of elevated right heart pressures/abnormal RV function or size, would also add right heart cath to procedure -already on furosemide 20 mg daily, carvedilol. BP borderline today. Based on results of echo, may need to adjust therapy.  Palpitations: only occurring 3-4  times/mos. No high risk features such as syncope. If becomes more frequent/severe, consider monitor.  History of CVA: needs secondary prevention. She is uncertain of cause/etiology -on aspirin 81 mg daily, atorvastatin 40 mg daily. See below re: LDL goal  Hypercholesterolemia: LDL on recent PCP notes 91, with ASCVD goal <70. On atorvastatin 40 mg, high intensity statin. Has muscle/leg weakness at baseline from prior stroke. Tolerating this, but I would like to intensify   to reach her LDL goal. -if there is any CAD on cath, would intensify dose of atorvastatin/change to rosuvastatin to reach goal  Cardiac risk counseling and secondary prevention recommendations: -recommend heart healthy/Mediterranean diet, with whole grains, fruits, vegetable, fish, lean meats, nuts, and olive oil. Limit salt. -recommend moderate walking, 3-5 times/week for 30-50 minutes each session. Aim for at least 150 minutes.week. Goal should be pace of 3 miles/hours, or walking 1.5 miles in 30 minutes -recommend avoidance of tobacco products. Avoid excess alcohol. -Additional risk factor control:  -Diabetes: A1c is 5.4, denies history of diabetes  -Lipids: as above  -Blood pressure control: normal to mildly (asymptomatic) hypotensive  -Weight: BMI 44.86, counseled on weight loss.  Plan for follow up: cath on 09/06/18. As I am primarily in the [place] office, she is welcome to come to that office to follow up with me, or she can follow up with one of my partners in the [facility] office post cath.  Medication Adjustments/Labs and Tests Ordered: Current medicines are reviewed at length with the patient today.  Concerns regarding medicines are outlined above.  Orders Placed This Encounter  Procedures  . Novel Coronavirus, NAA ([facility])  . Basic Metabolic Panel (BMET)  . CBC  . EKG 12-Lead  . ECHOCARDIOGRAM COMPLETE   No orders of the defined types were placed in this encounter.   Patient Instructions  Medication  Instructions:   Your physician recommends that you continue on your current medications as directed. Please refer to the Current Medication list given to you today. If you need a refill on your cardiac medications before your next appointment, please call your pharmacy.   Lab work:  Your physician recommends that you get a BMET and CBC at your urology appointment this afternoon.   COVID testing - You will need to go to the [facility] drive thru COVID testing location. You may go anytime between 08am to 3 pm. You will need to wear a mask. You will be required to self-quarantine from the time of testing until your procedure.   If you have labs (blood work) drawn today and your tests are completely normal, you will receive your results only by: . MyChart Message (if you have MyChart) OR . A paper copy in the mail If you have any lab test that is abnormal or we need to change your treatment, we will call you to review the results.  Testing/Procedures:  Your physician has requested that you have an Echocardiogram. Echocardiography is a painless test that uses sound waves to create images of your heart. It provides your doctor with information about the size and shape of your heart and how well your heart's chambers and valves are working. This procedure takes approximately one hour. There are no restrictions for this procedure. You may received an ultrasound enhancing agent called Definity through an IV if the technologist feels this is necessary.    Your physician has requested that you have a Left cardiac catheterization. Cardiac catheterization is used to diagnose and/or treat various heart conditions. Doctors may recommend this procedure for a number of different reasons. The most common reason is to evaluate chest pain. Chest pain can be a symptom of coronary artery disease (CAD), and cardiac catheterization can show whether plaque is narrowing or blocking your heart's arteries. This  procedure is also used to evaluate the valves, as well as measure the blood flow and oxygen levels in different parts of your heart. For further information please visit www.cardiosmart.org. Please   follow instruction sheet, as given.  [facility] Cardiac Cath Instructions   You are scheduled for a Cardiac Cath on:__08/20/2020_______  Please arrive at _6:30__am on the day of your procedure  Please expect a call from our  Pre-Service Center to pre-register you  Do not eat/drink anything after midnight  Someone will need to drive you home  It is recommended someone be with you for the first 24 hours after your procedure  Wear clothes that are easy to get on/off and wear slip on shoes if possible   Medications bring a current list of all medications with you  _X__ You may take all of your medications the morning of your procedure with enough water to swallow safely   Day of your procedure: Arrive at the [facility] entrance.  Free valet service is available.  After entering the [facility] please check-in at the registration desk (1st desk on your right) to receive your armband. After receiving your armband someone will escort you to the cardiac cath/special procedures waiting area.  The usual length of stay after your procedure is about 2 to 3 hours.  This can vary.  If you have any questions, please call our office at [phone], or you may call the cardiac cath lab at [facility] directly at [phone]     Follow-Up: At [facility], you and your health needs are our priority.  As part of our continuing mission to provide you with exceptional heart care, we have created designated Provider Care Teams.  These Care Teams include your primary Cardiologist (physician) and Advanced Practice Providers (APPs -  Physician Assistants and Nurse Practitioners) who all work together to provide you with the care you need, when you need it. You will need a follow up appointment in 1 -2  weeks with Dr. Layah Skousen or Dr. End (who is preforming Cath) .    You may see one of the following Advanced Practice Providers on your designated Care Team:   Tevon Berhane Berge, NP Ryan Dunn, PA-C . Jacquelyn Visser, PA-C   Coronary Angiogram A coronary angiogram is an X-ray procedure that is used to examine the arteries in the heart. In this procedure, a dye (contrast dye) is injected through a long, thin tube (catheter). The catheter is inserted through the groin, wrist, or arm. The dye is injected into each artery, then X-rays are taken to show if there is a blockage in the arteries of the heart. This procedure can also show if you have valve disease or a disease of the aorta, and it can be used to check the overall function of your heart muscle. You may have a coronary angiogram if:  You are having chest pain, or other symptoms of angina, and you are at risk for heart disease.  You have an abnormal electrocardiogram (ECG) or stress test.  You have chest pain and heart failure.  You are having irregular heart rhythms.  You and your health care provider determine that the benefits of the test information outweigh the risks of the procedure. Let your health care provider know about:  Any allergies you have, including allergies to contrast dye.  All medicines you are taking, including vitamins, herbs, eye drops, creams, and over-the-counter medicines.  Any problems you or family members have had with anesthetic medicines.  Any blood disorders you have.  Any surgeries you have had.  History of kidney problems or kidney failure.  Any medical conditions you have.  Whether you are pregnant or may be pregnant.   What are the risks? Generally, this is a safe procedure. However, problems may occur, including:  Infection.  Allergic reaction to medicines or dyes that are used.  Bleeding from the access site or other locations.  Kidney injury, especially in people with impaired  kidney function.  Stroke (rare).  Heart attack (rare).  Damage to other structures or organs. What happens before the procedure? Staying hydrated Follow instructions from your health care provider about hydration, which may include:  Up to 2 hours before the procedure - you may continue to drink clear liquids, such as water, clear fruit juice, black coffee, and plain tea. Eating and drinking restrictions Follow instructions from your health care provider about eating and drinking, which may include:  8 hours before the procedure - stop eating heavy meals or foods such as meat, fried foods, or fatty foods.  6 hours before the procedure - stop eating light meals or foods, such as toast or cereal.  2 hours before the procedure - stop drinking clear liquids. General instructions  Ask your health care provider about: ? Changing or stopping your regular medicines. This is especially important if you are taking diabetes medicines or blood thinners. ? Taking medicines such as ibuprofen. These medicines can thin your blood. Do not take these medicines before your procedure if your health care provider instructs you not to, though aspirin may be recommended prior to coronary angiograms.  Plan to have someone take you home from the hospital or clinic.  You may need to have blood tests or X-rays done. What happens during the procedure?  An IV tube will be inserted into one of your veins.  You will be given one or more of the following: ? A medicine to help you relax (sedative). ? A medicine to numb the area where the catheter will be inserted into an artery (local anesthetic).  To reduce your risk of infection: ? Your health care team will wash or sanitize their hands. ? Your skin will be washed with soap. ? Hair may be removed from the area where the catheter will be inserted.  You will be connected to a continuous ECG monitor.  The catheter will be inserted into an artery. The  location may be in your groin, in your wrist, or in the fold of your arm (near your elbow).  A type of X-ray (fluoroscopy) will be used to help guide the catheter to the opening of the blood vessel that is being examined.  A dye will be injected into the catheter, and X-rays will be taken. The dye will help to show where any narrowing or blockages are located in the heart arteries.  Tell your health care provider if you have any chest pain or trouble breathing during the procedure.  If blockages are found, your health care provider may perform another procedure, such as inserting a coronary stent. The procedure may vary among health care providers and hospitals. What happens after the procedure?  After the procedure, you will need to keep the area still for a few hours, or for as long as told by your health care provider. If the procedure is done through the groin, you will be instructed to not bend and not cross your legs.  The insertion site will be checked frequently.  The pulse in your foot or wrist will be checked frequently.  You may have additional blood tests, X-rays, and a test that records the electrical activity of your heart (ECG).  Do not drive for   24 hours if you were given a sedative. Summary  A coronary angiogram is an X-ray procedure that is used to look into the arteries in the heart.  During the procedure, a dye (contrast dye) is injected through a long, thin tube (catheter). The catheter is inserted through the groin, wrist, or arm.  Tell your health care provider about any allergies you have, including allergies to contrast dye.  After the procedure, you will need to keep the area still for a few hours, or for as long as told by your health care provider. This information is not intended to replace advice given to you by your health care provider. Make sure you discuss any questions you have with your health care provider. Document Released: 07/10/2002 Document  Revised: 12/16/2016 Document Reviewed: 10/16/2015 Elsevier Patient Education  2020 Elsevier Inc.      Signed, Abhishek Levesque, MD PhD 08/15/2018 11:06 AM    [facility] 

## 2018-08-28 ENCOUNTER — Ambulatory Visit (INDEPENDENT_AMBULATORY_CARE_PROVIDER_SITE_OTHER): Payer: PRIVATE HEALTH INSURANCE

## 2018-08-28 ENCOUNTER — Other Ambulatory Visit
Admission: RE | Admit: 2018-08-28 | Discharge: 2018-08-28 | Disposition: A | Discharge status: Home / Self Care | Payer: 59 | Source: Ambulatory Visit | Attending: Cardiology | Admitting: Cardiology

## 2018-08-28 ENCOUNTER — Other Ambulatory Visit: Payer: Self-pay

## 2018-08-28 DIAGNOSIS — R600 Localized edema: Secondary | ICD-10-CM | POA: Insufficient documentation

## 2018-08-28 DIAGNOSIS — R0789 Other chest pain: Secondary | ICD-10-CM

## 2018-08-28 DIAGNOSIS — R079 Chest pain, unspecified: Secondary | ICD-10-CM

## 2018-08-28 DIAGNOSIS — R0602 Shortness of breath: Secondary | ICD-10-CM | POA: Diagnosis not present

## 2018-08-28 DIAGNOSIS — Z01810 Encounter for preprocedural cardiovascular examination: Secondary | ICD-10-CM

## 2018-08-28 LAB — CBC
HCT: 45.7 % (ref 36.0–46.0)
Hemoglobin: 14.8 g/dL (ref 12.0–15.0)
MCH: 28.8 pg (ref 26.0–34.0)
MCHC: 32.4 g/dL (ref 30.0–36.0)
MCV: 88.9 fL (ref 80.0–100.0)
Platelets: 195 10*3/uL (ref 150–400)
RBC: 5.14 MIL/uL — ABNORMAL HIGH (ref 3.87–5.11)
RDW: 13.8 % (ref 11.5–15.5)
WBC: 5.4 10*3/uL (ref 4.0–10.5)
nRBC: 0 % (ref 0.0–0.2)

## 2018-08-28 LAB — BASIC METABOLIC PANEL
Anion gap: 8 (ref 5–15)
BUN: 14 mg/dL (ref 6–20)
CO2: 22 mmol/L (ref 22–32)
Calcium: 8.8 mg/dL — ABNORMAL LOW (ref 8.9–10.3)
Chloride: 107 mmol/L (ref 98–111)
Creatinine, Ser: 0.77 mg/dL (ref 0.44–1.00)
GFR calc Af Amer: >60 mL/min (ref >60)
GFR calc non Af Amer: >60 mL/min (ref >60)
Glucose, Bld: 151 mg/dL — ABNORMAL HIGH (ref 70–99)
Potassium: 3.6 mmol/L (ref 3.5–5.1)
Sodium: 137 mmol/L (ref 135–145)

## 2018-08-30 ENCOUNTER — Ambulatory Visit: Payer: 59 | Admitting: Physician Assistant

## 2018-08-30 ENCOUNTER — Ambulatory Visit: Payer: PRIVATE HEALTH INSURANCE

## 2018-09-03 ENCOUNTER — Other Ambulatory Visit: Admission: RE | Admit: 2018-09-03 | Payer: 59 | Source: Ambulatory Visit

## 2018-09-04 ENCOUNTER — Telehealth: Payer: Self-pay | Admitting: *Deleted

## 2018-09-04 ENCOUNTER — Other Ambulatory Visit: Payer: Self-pay

## 2018-09-04 ENCOUNTER — Other Ambulatory Visit: Payer: 59

## 2018-09-04 ENCOUNTER — Other Ambulatory Visit
Admission: RE | Admit: 2018-09-04 | Discharge: 2018-09-04 | Disposition: A | Discharge status: Home / Self Care | Payer: 59 | Source: Ambulatory Visit | Attending: Internal Medicine | Admitting: Internal Medicine

## 2018-09-04 DIAGNOSIS — Z20828 Contact with and (suspected) exposure to other viral communicable diseases: Secondary | ICD-10-CM | POA: Diagnosis not present

## 2018-09-04 DIAGNOSIS — Z01812 Encounter for preprocedural laboratory examination: Secondary | ICD-10-CM | POA: Diagnosis present

## 2018-09-04 NOTE — Telephone Encounter (Signed)
Patient has cath on 09/06/18.  Called and spoke with patient as a reminder for her to go get her preop COVID swab done today.  She verbalized understanding and will go today before 3 pm.

## 2018-09-05 LAB — SARS CORONAVIRUS 2 (TAT 6-24 HRS): SARS Coronavirus 2: NEGATIVE

## 2018-09-06 ENCOUNTER — Other Ambulatory Visit: Payer: Self-pay

## 2018-09-06 ENCOUNTER — Ambulatory Visit
Admission: RE | Admit: 2018-09-06 | Discharge: 2018-09-06 | Disposition: A | Discharge status: Home / Self Care | Payer: 59 | Attending: Cardiovascular Disease | Admitting: Cardiovascular Disease

## 2018-09-06 ENCOUNTER — Encounter: Payer: Self-pay | Admitting: Cardiovascular Disease

## 2018-09-06 ENCOUNTER — Encounter
Admission: RE | Disposition: A | Discharge status: Home / Self Care | Payer: PRIVATE HEALTH INSURANCE | Source: Home / Self Care | Attending: Cardiovascular Disease

## 2018-09-06 DIAGNOSIS — I252 Old myocardial infarction: Secondary | ICD-10-CM | POA: Diagnosis not present

## 2018-09-06 DIAGNOSIS — Z7982 Long term (current) use of aspirin: Secondary | ICD-10-CM | POA: Diagnosis not present

## 2018-09-06 DIAGNOSIS — Z79899 Other long term (current) drug therapy: Secondary | ICD-10-CM | POA: Diagnosis not present

## 2018-09-06 DIAGNOSIS — J449 Chronic obstructive pulmonary disease, unspecified: Secondary | ICD-10-CM | POA: Diagnosis not present

## 2018-09-06 DIAGNOSIS — R079 Chest pain, unspecified: Secondary | ICD-10-CM | POA: Insufficient documentation

## 2018-09-06 DIAGNOSIS — Z87891 Personal history of nicotine dependence: Secondary | ICD-10-CM | POA: Insufficient documentation

## 2018-09-06 DIAGNOSIS — Z6841 Body Mass Index (BMI) 40.0 and over, adult: Secondary | ICD-10-CM | POA: Insufficient documentation

## 2018-09-06 DIAGNOSIS — Z8249 Family history of ischemic heart disease and other diseases of the circulatory system: Secondary | ICD-10-CM | POA: Diagnosis not present

## 2018-09-06 DIAGNOSIS — Z8673 Personal history of transient ischemic attack (TIA), and cerebral infarction without residual deficits: Secondary | ICD-10-CM | POA: Insufficient documentation

## 2018-09-06 DIAGNOSIS — E7800 Pure hypercholesterolemia, unspecified: Secondary | ICD-10-CM | POA: Insufficient documentation

## 2018-09-06 DIAGNOSIS — Z880 Allergy status to penicillin: Secondary | ICD-10-CM | POA: Diagnosis not present

## 2018-09-06 DIAGNOSIS — G629 Polyneuropathy, unspecified: Secondary | ICD-10-CM | POA: Diagnosis not present

## 2018-09-06 DIAGNOSIS — Z90710 Acquired absence of both cervix and uterus: Secondary | ICD-10-CM | POA: Diagnosis not present

## 2018-09-06 DIAGNOSIS — I2510 Atherosclerotic heart disease of native coronary artery without angina pectoris: Secondary | ICD-10-CM | POA: Diagnosis not present

## 2018-09-06 HISTORY — PX: LEFT HEART CATH AND CORONARY ANGIOGRAPHY: CATH118249

## 2018-09-06 SURGERY — LEFT HEART CATH AND CORONARY ANGIOGRAPHY
Anesthesia: Moderate Sedation

## 2018-09-06 MED ORDER — SODIUM CHLORIDE 0.9 % IV SOLN: 250.0000 mL | INTRAVENOUS | Status: DC | PRN

## 2018-09-06 MED ORDER — IOHEXOL 300 MG/ML  SOLN
INTRAMUSCULAR | Status: DC | PRN
  Administered 2018-09-06: 80 mL via INTRA_ARTERIAL

## 2018-09-06 MED ORDER — HEPARIN (PORCINE) IN NACL 1000-0.9 UT/500ML-% IV SOLN
INTRAVENOUS | Status: AC
  Filled 2018-09-06: qty 1000

## 2018-09-06 MED ORDER — SODIUM CHLORIDE 0.9% FLUSH: 3.0000 mL | Freq: Two times a day (BID) | INTRAVENOUS | Status: DC

## 2018-09-06 MED ORDER — ASPIRIN 81 MG PO CHEW
81.0000 mg | CHEWABLE_TABLET | ORAL | Status: AC
  Administered 2018-09-06: 81 mg via ORAL

## 2018-09-06 MED ORDER — SODIUM CHLORIDE 0.9% FLUSH: 3.0000 mL | INTRAVENOUS | Status: DC | PRN

## 2018-09-06 MED ORDER — VERAPAMIL HCL 2.5 MG/ML IV SOLN
INTRAVENOUS | Status: AC
  Filled 2018-09-06: qty 2

## 2018-09-06 MED ORDER — HEPARIN SODIUM (PORCINE) 1000 UNIT/ML IJ SOLN
INTRAMUSCULAR | Status: DC | PRN
  Administered 2018-09-06: 5000 [IU] via INTRAVENOUS

## 2018-09-06 MED ORDER — MIDAZOLAM HCL 2 MG/2ML IJ SOLN
INTRAMUSCULAR | Status: DC | PRN
  Administered 2018-09-06 (×2): 1 mg via INTRAVENOUS

## 2018-09-06 MED ORDER — FENTANYL CITRATE (PF) 100 MCG/2ML IJ SOLN
INTRAMUSCULAR | Status: AC
  Filled 2018-09-06: qty 2

## 2018-09-06 MED ORDER — ONDANSETRON HCL 4 MG/2ML IJ SOLN: 4.0000 mg | Freq: Four times a day (QID) | INTRAMUSCULAR | Status: DC | PRN

## 2018-09-06 MED ORDER — VERAPAMIL HCL 2.5 MG/ML IV SOLN
INTRAVENOUS | Status: DC | PRN
  Administered 2018-09-06: 2.5 mg via INTRA_ARTERIAL

## 2018-09-06 MED ORDER — ASPIRIN 81 MG PO CHEW
CHEWABLE_TABLET | ORAL | Status: AC
  Filled 2018-09-06: qty 1

## 2018-09-06 MED ORDER — LIDOCAINE HCL (PF) 1 % IJ SOLN
INTRAMUSCULAR | Status: DC | PRN
  Administered 2018-09-06: 2 mL

## 2018-09-06 MED ORDER — SODIUM CHLORIDE 0.9 % IV SOLN: INTRAVENOUS | Status: DC

## 2018-09-06 MED ORDER — HEPARIN (PORCINE) IN NACL 1000-0.9 UT/500ML-% IV SOLN
INTRAVENOUS | Status: DC | PRN
  Administered 2018-09-06: 500 mL

## 2018-09-06 MED ORDER — MIDAZOLAM HCL 2 MG/2ML IJ SOLN
INTRAMUSCULAR | Status: AC
  Filled 2018-09-06: qty 2

## 2018-09-06 MED ORDER — HEPARIN SODIUM (PORCINE) 1000 UNIT/ML IJ SOLN
INTRAMUSCULAR | Status: AC
  Filled 2018-09-06: qty 1

## 2018-09-06 MED ORDER — SODIUM CHLORIDE 0.9 % IV SOLN
INTRAVENOUS | Status: DC
  Administered 2018-09-06: 07:00:00 via INTRAVENOUS

## 2018-09-06 MED ORDER — FENTANYL CITRATE (PF) 100 MCG/2ML IJ SOLN
INTRAMUSCULAR | Status: DC | PRN
  Administered 2018-09-06: 25 ug via INTRAVENOUS

## 2018-09-06 MED ORDER — ACETAMINOPHEN 325 MG PO TABS: 650.0000 mg | ORAL_TABLET | ORAL | Status: DC | PRN

## 2018-09-06 SURGICAL SUPPLY — 7 items
CATH INFINITI 5FR ANG PIGTAIL (CATHETERS) ×2 IMPLANT
CATH INFINITI 5FR JK (CATHETERS) ×2 IMPLANT
DEVICE RAD COMP TR BAND LRG (VASCULAR PRODUCTS) ×2 IMPLANT
GLIDESHEATH SLEND SS 6F .021 (SHEATH) ×2 IMPLANT
KIT MANI 3VAL PERCEP (MISCELLANEOUS) ×3 IMPLANT
PACK CARDIAC CATH (CUSTOM PROCEDURE TRAY) ×3 IMPLANT
WIRE ROSEN-J .035X260CM (WIRE) ×2 IMPLANT

## 2018-09-06 NOTE — Interval H&P Note (Signed)
History and Physical Interval Note:  09/06/2018 7:55 AM  Kelly Horton  has presented today for surgery, with the diagnosis of LT Cath    Chest pain.  The various methods of treatment have been discussed with the patient and family. After consideration of risks, benefits and other options for treatment, the patient has consented to  Procedure(s): LEFT HEART CATH AND CORONARY ANGIOGRAPHY (N/A) as a surgical intervention.  The patient's history has been reviewed, patient examined, no change in status, stable for surgery.  I have reviewed the patient's chart and labs.  Questions were answered to the patient's satisfaction.     [name]

## 2018-09-10 ENCOUNTER — Ambulatory Visit: Payer: 59

## 2018-09-12 ENCOUNTER — Other Ambulatory Visit: Payer: PRIVATE HEALTH INSURANCE | Admitting: Urology

## 2018-09-13 ENCOUNTER — Encounter: Payer: Self-pay | Admitting: Urology

## 2018-09-17 NOTE — Progress Notes (Signed)
Cardiology Office Note    Date:  09/21/2018   ID:  Kelly Horton, DOB [birth year], MRN [id number]  PCP:  [facility]  Cardiologist:  Jodelle RedBridgette Christopher, MD  Electrophysiologist:  None   Chief Complaint: Follow up  History of Present Illness:   Kelly Horton is a 43 y.o. 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 MI in 2018 in [place] without cardiac cath being done at that time, COPD secondary to prior tobacco abuse quitting on 02/01/2018 and smoking for 60 pack years, hypertension, hyperlipidemia, elevated liver function testing, DJD, neuropathy, morbid obesity, reported family history of premature coronary artery disease with her mother having 4 MIs (first when she was in her early 7720s and last in her late 1660s, maternal grandmother with four-vessel CABG, maternal uncle with CABG, and brother with 3 MIs with the first being in his early 6330s with him subsequently passing at age 45 from heart failure who presents for follow-up of diagnostic cath and echo.  Patient was seen as a new patient consult in the clinic setting on 08/15/2018 by Dr. Cristal Deerhristopher.  At that time, the patient had numerous complaints including intermittent chest pain that was worse when laying flat, lower extremity swelling for 2 to 3 years, and palpitations.  Please see extensive initial consult note from 08/15/2018 for details.  Given the above, the patient underwent echo on 08/28/2018 which showed an EF of 60 to 65%, mildly dilated LV cavity, normal LV diastolic function, no regional wall motion normalities, normal RV systolic function, normal RV cavity size, trileaflet aortic valve, normal size and structure aorta with no significant valvular abnormalities.  Diagnostic cardiac cath on  09/06/2018 showed normal coronary arteries with normal LV systolic function and mildly elevated LVEDP at 13 mmHg.  Patient chest pain was felt to be noncardiac.  She presents today for follow-up of her diagnostic cath.  Patient comes in doing reasonably well.  She continues to note intermittent chest discomfort that feels like a heaviness on her chest and improves when she takes an as needed Lasix.  She continues to note intermittent palpitations that have been present for at least 2 years and occur several times per month and last approximately several seconds with spontaneous resolution with associated dizziness.  She does note these palpitations will occur anytime she has sexual intercourse with her husband.  She continues to note lower extremity swelling.  No issues from the right radial cardiac cath site.  No abdominal surgeon, orthopnea, PND, early satiety.   Labs: 08/2018 -potassium 3.6, serum creatinine 0.77, Hgb 14.8, PLT 195 06/2018 -albumin 4.1, AST/ALT normal, total cholesterol 170, triglyceride 147, HDL 50, LDL 91, A1c 5.4   Past Medical History:  Diagnosis Date   COPD (chronic obstructive pulmonary disease) (HCC)    DJD (degenerative joint disease)    Elevated liver function tests    Essential hypertension    History of echocardiogram    a.  TTE 08/2018: EF 60 to 65%, mildly dilated LV cavity, normal LV diastolic function, no regional wall motion normalities, normal RV systolic function, normal RV cavity size, trileaflet aortic valve with no significant valvular abnormalities, normal size and structure aorta   Hyperlipidemia    Migraine    a.  Complex migraines following stroke in 2003 with strokelike symptoms  Morbid obesity (HCC)    Normal coronary arteries    a.  Patient reported MI in 2018 (details unclear); b.  LHC 08/2018: Normal coronary arteries, normal LV SF, mildly elevated LVEDP 13 mmHg   Stroke [place]) 2003    Past Surgical History:  Procedure Laterality Date    LEFT HEART CATH AND CORONARY ANGIOGRAPHY N/A 09/06/2018   Procedure: LEFT HEART CATH AND CORONARY ANGIOGRAPHY;  Surgeon: Iran Ouch, MD;  Location: [facility] INVASIVE CV LAB;  Service: Cardiovascular;  Laterality: N/A;    Current Medications: Current Meds  Medication Sig   acetaminophen (TYLENOL) 500 MG tablet Take 1,000-1,500 mg by mouth 3 (three) times daily as needed for headache.    albuterol (VENTOLIN HFA) 108 (90 Base) MCG/ACT inhaler Inhale 2 puffs into the lungs every 6 (six) hours as needed for wheezing or shortness of breath.   aspirin EC 81 MG tablet Take 81 mg by mouth daily.   carvedilol (COREG) 6.25 MG tablet Take 6.25 mg by mouth 2 (two) times a day.    escitalopram (LEXAPRO) 20 MG tablet Take 20 mg by mouth at bedtime.    furosemide (LASIX) 20 MG tablet Take 20 mg by mouth daily as needed for edema.    gabapentin (NEURONTIN) 100 MG capsule Take 100 mg by mouth at bedtime.    omeprazole (PRILOSEC) 20 MG capsule Take 20 mg by mouth daily.   oxymetazoline (AFRIN) 0.05 % nasal spray Place 1 spray into both nostrils 2 (two) times daily as needed for congestion.   SPIRIVA HANDIHALER 18 MCG inhalation capsule Place 18 mcg into inhaler and inhale daily as needed (shortness of breath).    [DISCONTINUED] atorvastatin (LIPITOR) 40 MG tablet Take 40 mg by mouth at bedtime.      Allergies:   Mushroom extract complex, Penicillins, and Shellfish allergy   Social History   Socioeconomic History   Marital status: Married    Spouse name: Not on file   Number of children: Not on file   Years of education: Not on file   Highest education level: Not on file  Occupational History   Not on file  Social Needs   Financial resource strain: Not on file   Food insecurity    Worry: Not on file    Inability: Not on file   Transportation needs    Medical: Not on file    Non-medical: Not on file  Tobacco Use   Smoking status: Former Smoker    Packs/day: 2.00     Years: 30.00    Pack years: 60.00    Types: Cigarettes    Quit date: 02/01/2018    Years since quitting: 0.6   Smokeless tobacco: Never Used  Substance and Sexual Activity   Alcohol use: No    Alcohol/week: 0.0 standard drinks   Drug use: Not Currently    Types: Marijuana   Sexual activity: Not on file  Lifestyle   Physical activity    Days per week: Not on file    Minutes per session: Not on file   Stress: Not on file  Relationships   Social connections    Talks on phone: Not on file    Gets together: Not on file    Attends religious service: Not on file    Active member of club or organization: Not on file    Attends meetings of clubs or organizations: Not on file    Relationship status: Not on file  Other Topics Concern   Not  on file  Social History Narrative   Not on file     Family History:  The patient's family history includes Breast cancer in her mother; CAD in her brother, maternal grandmother, and mother; Diabetes in her mother; Emphysema in her father and mother; Heart failure in her brother; Hypertension in her father; Lung cancer in her mother; Mitral valve prolapse in her mother; Prostate cancer in her father.  ROS:   Review of Systems  Constitutional: Positive for malaise/fatigue. Negative for chills, diaphoresis, fever and weight loss.  HENT: Negative for congestion.   Eyes: Negative for discharge and redness.  Respiratory: Positive for shortness of breath. Negative for cough, hemoptysis, sputum production and wheezing.   Cardiovascular: Positive for chest pain, palpitations and leg swelling. Negative for orthopnea, claudication and PND.  Gastrointestinal: Negative for abdominal pain, blood in stool, heartburn, melena, nausea and vomiting.  Genitourinary: Negative for hematuria.  Musculoskeletal: Positive for myalgias. Negative for falls.  Skin: Negative for rash.  Neurological: Positive for weakness. Negative for dizziness, tingling, tremors,  sensory change, speech change, focal weakness and loss of consciousness.  Endo/Heme/Allergies: Does not bruise/bleed easily.  Psychiatric/Behavioral: Negative for substance abuse. The patient is not nervous/anxious.   All other systems reviewed and are negative.    EKGs/Labs/Other Studies Reviewed:    Studies reviewed were summarized above. The additional studies were reviewed today:  2D Echo 08/28/2018:  1. The left ventricle has normal systolic function with an ejection fraction of 60-65%. The cavity size was mildly dilated. Left ventricular diastolic parameters were normal. No evidence of left ventricular regional wall motion abnormalities.  2. The right ventricle has normal systolic function. The cavity was normal. There is no increase in right ventricular wall thickness. Right ventricular systolic pressure could not be assessed.  3. The aortic valve is tricuspid.  4. The aorta is normal in size and structure. __________  LHC 09/06/2018:  The left ventricular systolic function is normal.  LV end diastolic pressure is mildly elevated.  The left ventricular ejection fraction is 55-65% by visual estimate.   1.  Left dominant system with normal coronary arteries. 2.  Normal LV systolic function and mildly elevated left ventricular end-diastolic pressure at 13 mmHg.  Recommendations: Noncardiac chest pain.  Continue treatment of risk factors.   EKG:  EKG is ordered today.  The EKG ordered today demonstrates NSR, 79 bpm, no acute ST-T changes  Recent Labs: 02/07/2018: ALT 131; Magnesium 2.0 08/28/2018: BUN 14; Creatinine, Ser 0.77; Hemoglobin 14.8; Platelets 195; Potassium 3.6; Sodium 137  Recent Lipid Panel No results found for: CHOL, TRIG, HDL, CHOLHDL, VLDL, LDLCALC, LDLDIRECT  PHYSICAL EXAM:    VS:  BP 118/70 (BP Location: Left Arm, Patient Position: Sitting, Cuff Size: Large)    Pulse 84    Ht 5\' 3"  (1.6 m)    Wt 251 lb (113.9 kg)    SpO2 98%    BMI 44.46 kg/m   BMI: Body  mass index is 44.46 kg/m.  Physical Exam  Constitutional: She is oriented to person, place, and time. She appears well-developed and well-nourished.  HENT:  Head: Normocephalic and atraumatic.  Eyes: Right eye exhibits no discharge. Left eye exhibits no discharge.  Neck: Normal range of motion. No JVD present.  Cardiovascular: Normal rate, regular rhythm, S1 normal, S2 normal and normal heart sounds. Exam reveals no distant heart sounds, no friction rub, no midsystolic click and no opening snap.  No murmur heard. Pulses:      Dorsalis pedis pulses  are 2+ on the right side and 2+ on the left side.       Posterior tibial pulses are 2+ on the right side and 2+ on the left side.  Right radial cardiac cath site is well-healing without any active bleeding, bruising, swelling, warmth, erythema, or tenderness to palpation.  Radial pulse 2+.  Pulmonary/Chest: Effort normal and breath sounds normal. No respiratory distress. She has no decreased breath sounds. She has no wheezes. She has no rales. She exhibits no tenderness.  Abdominal: Soft. She exhibits no distension. There is no abdominal tenderness.  Musculoskeletal:        General: No edema.  Neurological: She is alert and oriented to person, place, and time.  Skin: Skin is warm and dry. No cyanosis. Nails show no clubbing.  Psychiatric: She has a normal mood and affect. Her speech is normal and behavior is normal. Judgment and thought content normal.    Wt Readings from Last 3 Encounters:  09/21/18 251 lb (113.9 kg)  09/06/18 245 lb (111.1 kg)  08/15/18 245 lb (111.1 kg)     ASSESSMENT & PLAN:   1. Normal coronary arteries: Cardiac cath on 09/06/2018 demonstrated normal coronary arteries.  Continue risk factor modification and treatment with aspirin and atorvastatin as outlined below.  Post cardiac cath instructions discussed in detail.  No plans for further ischemic evaluation.    2. Palpitations: Longstanding issue for approximately 2  years and occur sporadically several times per month lasting several seconds.  She does note if palpitations will occur every time she attempts to have sexual intercourse with her husband.  Place ZIO monitor.  Check TSH, CMP, and CBC.  3. Chest pain/dyspnea/lower extremity swelling: She continues to note mild intermittent chest discomfort that improves if she takes Lasix periodically.  Echo and cardiac cath unrevealing as outlined above.  For now, as needed Lasix as long as renal function and potassium remain stable.  Cannot exclude component of dependent edema in the setting of venous insufficiency with morbid obesity.  Recommend leg elevation and compression stockings as tolerated.  Check CBC and CMP.    4. History of stroke: Uncertain etiology.  Schedule Zio monitor as above.  Transition from Lipitor to Crestor given myalgias.  5. Hyperlipidemia: LDL of 91 from 06/2018.  Given history of stroke, goal LDL less than 70.  Patient notes some lower extremity cramping and myalgias and is uncertain if this is related to her Lipitor or not.  In this setting, we recommend she discontinue Lipitor for the next 1 week followed by initiation of Crestor 5 mg daily.  Symptoms will be reassessed at next visit.  6. Hypertension: Blood pressure well controlled today.  Continue Coreg and as needed Lasix.  7. Transaminitis: Transient and resolved.  Disposition: F/u with any [facility] MD in 2 to 3 months.   Medication Adjustments/Labs and Tests Ordered: Current medicines are reviewed at length with the patient today.  Concerns regarding medicines are outlined above. Medication changes, Labs and Tests ordered today are summarized above and listed in the Patient Instructions accessible in Encounters.   Signed, Christell Faith, PA-C 09/21/2018 8:43 AM     [facility] [address] [place] [address] [place], [place] [address] [phone]

## 2018-09-20 ENCOUNTER — Encounter: Payer: Self-pay | Admitting: Physician Assistant

## 2018-09-21 ENCOUNTER — Ambulatory Visit (INDEPENDENT_AMBULATORY_CARE_PROVIDER_SITE_OTHER): Payer: 59

## 2018-09-21 ENCOUNTER — Encounter: Payer: Self-pay | Admitting: Physician Assistant

## 2018-09-21 ENCOUNTER — Other Ambulatory Visit: Payer: Self-pay

## 2018-09-21 ENCOUNTER — Ambulatory Visit (INDEPENDENT_AMBULATORY_CARE_PROVIDER_SITE_OTHER): Payer: 59 | Admitting: Physician Assistant

## 2018-09-21 VITALS — BP 118/70 | HR 84 | Ht 63.0 in | Wt 251.0 lb

## 2018-09-21 DIAGNOSIS — R0789 Other chest pain: Secondary | ICD-10-CM

## 2018-09-21 DIAGNOSIS — R0602 Shortness of breath: Secondary | ICD-10-CM | POA: Diagnosis not present

## 2018-09-21 DIAGNOSIS — Z0389 Encounter for observation for other suspected diseases and conditions ruled out: Secondary | ICD-10-CM | POA: Diagnosis not present

## 2018-09-21 DIAGNOSIS — IMO0001 Reserved for inherently not codable concepts without codable children: Secondary | ICD-10-CM

## 2018-09-21 DIAGNOSIS — Z79899 Other long term (current) drug therapy: Secondary | ICD-10-CM

## 2018-09-21 DIAGNOSIS — R600 Localized edema: Secondary | ICD-10-CM

## 2018-09-21 DIAGNOSIS — R002 Palpitations: Secondary | ICD-10-CM

## 2018-09-21 DIAGNOSIS — E785 Hyperlipidemia, unspecified: Secondary | ICD-10-CM

## 2018-09-21 DIAGNOSIS — Z8673 Personal history of transient ischemic attack (TIA), and cerebral infarction without residual deficits: Secondary | ICD-10-CM

## 2018-09-21 DIAGNOSIS — Z6841 Body Mass Index (BMI) 40.0 and over, adult: Secondary | ICD-10-CM

## 2018-09-21 MED ORDER — ROSUVASTATIN CALCIUM 5 MG PO TABS: 5.0000 mg | ORAL_TABLET | Freq: Every day | ORAL | 3 refills | Status: DC

## 2018-09-21 NOTE — Patient Instructions (Signed)
Medication Instructions:  Your physician has recommended you make the following change in your medication:  1- STOP Lipitor for 1 week to see if symptoms resolve. 2- THEN START Crestor 5 mg by mouth once a day.  If you need a refill on your cardiac medications before your next appointment, please call your pharmacy.   Lab work: Your physician recommends that you return for lab work in: TODAY (CMET, CBC, TSH).  If you have labs (blood work) drawn today and your tests are completely normal, you will receive your results only by: Marland Kitchen MyChart Message (if you have MyChart) OR . A paper copy in the mail If you have any lab test that is abnormal or we need to change your treatment, we will call you to review the results.  Testing/Procedures: Your physician has recommended that you wear an 14 DAY ZIO event monitor. Event monitors are medical devices that record the heart's electrical activity. Doctors most often [place] these monitors to diagnose arrhythmias. Arrhythmias are problems with the speed or rhythm of the heartbeat. The monitor is a small, portable device. You can wear one while you do your normal daily activities. This is usually used to diagnose what is causing palpitations/syncope (passing out).  A Zio Patch Event Heart monitor will be applied to your chest today.  You will wear the patch for 14 days. After 24 hours, you may shower with the heart monitor on. If you feel any PALPITATIONS, you may press and release the button in the middle of the monitor.    Follow-Up: At [facility], you and your health needs are our priority.  As part of our continuing mission to provide you with exceptional heart care, we have created designated Provider Care Teams.  These Care Teams include your primary Cardiologist (physician) and Advanced Practice Providers (APPs -  Physician Assistants and Nurse Practitioners) who all work together to provide you with the care you need, when you need it. You will  need a follow up appointment in 2-3 months.  Please call our office 2 months in advance to schedule this appointment.  You may see Buford Dresser, MD or one of the other Cardiologists to establish.

## 2018-09-22 LAB — COMPREHENSIVE METABOLIC PANEL
ALT: 36 IU/L — ABNORMAL HIGH (ref 0–32)
AST: 21 IU/L (ref 0–40)
Albumin/Globulin Ratio: 1.8 (ref 1.2–2.2)
Albumin: 4.1 g/dL (ref 3.8–4.8)
Alkaline Phosphatase: 86 IU/L (ref 39–117)
BUN/Creatinine Ratio: 25 — ABNORMAL HIGH (ref 9–23)
BUN: 23 mg/dL (ref 6–24)
Bilirubin Total: 0.4 mg/dL (ref 0.0–1.2)
CO2: 22 mmol/L (ref 20–29)
Calcium: 8.7 mg/dL (ref 8.7–10.2)
Chloride: 107 mmol/L — ABNORMAL HIGH (ref 96–106)
Creatinine, Ser: 0.93 mg/dL (ref 0.57–1.00)
GFR calc Af Amer: 86 mL/min/{1.73_m2} (ref >59)
GFR calc non Af Amer: 74 mL/min/{1.73_m2} (ref >59)
Globulin, Total: 2.3 g/dL (ref 1.5–4.5)
Glucose: 88 mg/dL (ref 65–99)
Potassium: 4.2 mmol/L (ref 3.5–5.2)
Sodium: 143 mmol/L (ref 134–144)
Total Protein: 6.4 g/dL (ref 6.0–8.5)

## 2018-09-22 LAB — CBC WITH DIFFERENTIAL/PLATELET
Basophils Absolute: 0 10*3/uL (ref 0.0–0.2)
Basos: 1 %
EOS (ABSOLUTE): 0.2 10*3/uL (ref 0.0–0.4)
Eos: 4 %
Hematocrit: 44.2 % (ref 34.0–46.6)
Hemoglobin: 14.5 g/dL (ref 11.1–15.9)
Immature Grans (Abs): 0 10*3/uL (ref 0.0–0.1)
Immature Granulocytes: 1 %
Lymphocytes Absolute: 1.6 10*3/uL (ref 0.7–3.1)
Lymphs: 37 %
MCH: 29.2 pg (ref 26.6–33.0)
MCHC: 32.8 g/dL (ref 31.5–35.7)
MCV: 89 fL (ref 79–97)
Monocytes Absolute: 0.6 10*3/uL (ref 0.1–0.9)
Monocytes: 13 %
Neutrophils Absolute: 2 10*3/uL (ref 1.4–7.0)
Neutrophils: 44 %
Platelets: 219 10*3/uL (ref 150–450)
RBC: 4.97 x10E6/uL (ref 3.77–5.28)
RDW: 13.7 % (ref 11.7–15.4)
WBC: 4.4 10*3/uL (ref 3.4–10.8)

## 2018-09-22 LAB — TSH: TSH: 8.1 u[IU]/mL — ABNORMAL HIGH (ref 0.450–4.500)

## 2018-09-25 ENCOUNTER — Telehealth: Payer: Self-pay

## 2018-09-25 NOTE — Telephone Encounter (Signed)
-----   Message from Kelly Mu, PA-C sent at 09/24/2018  9:28 AM EDT ----- Renal function normal.  Potassium at goal. Random glucose normal.  One liver function test is mildly elevated and improved from prior - follow up with PCP.  Blood count normal.  Thyroid function is abnormal and elevated - this maybe playing a role in her symptoms, schedule an appointment with PCP for further labs and evaluation with possible need for medication.

## 2018-09-25 NOTE — Telephone Encounter (Signed)
Called to give the patient lab results and Ryan Dunn, PA recommendation. [email or web address]. 

## 2018-09-26 NOTE — Telephone Encounter (Signed)
Attempted to call patient. LMTCB 09/26/2018   

## 2018-09-27 ENCOUNTER — Encounter: Payer: Self-pay | Admitting: *Deleted

## 2018-09-27 NOTE — Telephone Encounter (Signed)
No answer. Left message to call back and that I would be sending a letter with the results and recommendations for her to review.

## 2018-10-19 ENCOUNTER — Telehealth: Payer: Self-pay

## 2018-10-19 NOTE — Telephone Encounter (Signed)
The patient has been notified of the result and verbalized understanding.  All questions (if any) were answered. Wilma Flavin, RN 10/19/2018 11:33 AM

## 2018-11-15 NOTE — Progress Notes (Deleted)
Cardiology Office Note    Date:  11/15/2018   ID:  Kelly Horton, DOB January 16, 1976, MRN [id number]  PCP:  [facility]  Cardiologist:  Jodelle Red, MD  Electrophysiologist:  None   Chief Complaint: Follow up  History of Present Illness:   Kelly Horton is a 43 y.o. 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 MI in 2018 in [place] without cardiac cath being done at that time, COPD secondary to prior tobacco abuse quitting on 02/01/2018 and smoking for 60 pack years, hypertension, hyperlipidemia, elevated liver function testing, DJD, neuropathy, morbid obesity, reported family history of premature coronary artery disease with her mother having 4 MIs (first when she was in her early 74s and last in her late 85s, maternal grandmother with four-vessel CABG, maternal uncle with CABG, and brother with 3 MIs with the first being in his early 90s with him subsequently passing at age 7 from heart failure who presents for follow-up of ***.  Patient was seen as a new patient consult in the clinic setting on 08/15/2018 by Dr. Cristal Deer.  At that time, the patient had numerous complaints including intermittent chest pain that was worse when laying flat, lower extremity swelling for 2 to 3 years, and palpitations.  Please see extensive initial consult note from 08/15/2018 for details.  Given the above, the patient underwent echo on 08/28/2018 which showed an EF of 60 to 65%, mildly dilated LV cavity, normal LV diastolic function, no regional wall motion normalities, normal RV systolic function, normal RV cavity size, trileaflet aortic valve, normal size and structure aorta with no significant valvular abnormalities.  Diagnostic cardiac cath on 09/06/2018 showed  normal coronary arteries with normal LV systolic function and mildly elevated LVEDP at 13 mmHg.  Patient chest pain was felt to be noncardiac.  She followed up on 09/21/2018 and was doing reasonably well from a cardiac perspective.  She continued to note intermittent chest discomfort that improved with an as needed Lasix.  She also noted continued intermittent palpitations that had been present for at least 2 years, occurring several times per month and lasting several seconds at a time.  Palpitations typically occurred during sexual intercourse.  Subsequent Zio monitor showed the patient was only monitored for 2.5 days which showed a predominant rhythm of sinus with an average heart rate of 92 bpm (60-134 bpm), without any evidence of Afib, high degree AV block, or pauses. Patient triggered events corresponded to sinus rhythm/PAC. One brief episode of tachycardia that could not be well defined lasting 4 beats with the initial beat appearing to be aberrantly conducted sinus rhythm with the next three beats similar in morphology to sinus rhythm but slightly wider and without prior P wave. Overall, this was a reassuring monitor.   ***   Labs: 09/2018 - HGB 14.5, PLT 219, BUN 23, SCr 0.93, K+ 4.2, albumin 4.1, AST normal, ALT 36, TSH 8.1 06/2018 - albumin 4.1, AST/ALT normal, total cholesterol 170, triglyceride 147, HDL 50, LDL 91, A1c 5.4  Past Medical History:  Diagnosis Date  . COPD (chronic obstructive pulmonary disease) (HCC)   . DJD (degenerative joint disease)   . Elevated liver function tests   . Essential hypertension   . History of echocardiogram    a.  TTE 08/2018: EF 60 to 65%, mildly  dilated LV cavity, normal LV diastolic function, no regional wall motion normalities, normal RV systolic function, normal RV cavity size, trileaflet aortic valve with no significant valvular abnormalities, normal size and structure aorta  . Hyperlipidemia   . Migraine    a.  Complex migraines following  stroke in 2003 with strokelike symptoms  . Morbid obesity (HCC)   . Normal coronary arteries    a.  Patient reported MI in 2018 (details unclear); b.  LHC 08/2018: Normal coronary arteries, normal LV SF, mildly elevated LVEDP 13 mmHg  . Stroke [facility](HCC) 2003    Past Surgical History:  Procedure Laterality Date  . LEFT HEART CATH AND CORONARY ANGIOGRAPHY N/A 09/06/2018   Procedure: LEFT HEART CATH AND CORONARY ANGIOGRAPHY;  Surgeon: Iran OuchArida, Muhammad A, MD;  Location: [facility] INVASIVE CV LAB;  Service: Cardiovascular;  Laterality: N/A;    Current Medications: No outpatient medications have been marked as taking for the 11/19/18 encounter (Appointment) with Sondra Bargesunn, Dakisha Schoof M, PA-C.    Allergies:   Mushroom extract complex, Penicillins, and Shellfish allergy   Social History   Socioeconomic History  . Marital status: Married    Spouse name: Not on file  . Number of children: Not on file  . Years of education: Not on file  . Highest education level: Not on file  Occupational History  . Not on file  Social Needs  . Financial resource strain: Not on file  . Food insecurity    Worry: Not on file    Inability: Not on file  . Transportation needs    Medical: Not on file    Non-medical: Not on file  Tobacco Use  . Smoking status: Former Smoker    Packs/day: 2.00    Years: 30.00    Pack years: 60.00    Types: Cigarettes    Quit date: 02/01/2018    Years since quitting: 0.7  . Smokeless tobacco: Never Used  Substance and Sexual Activity  . Alcohol use: No    Alcohol/week: 0.0 standard drinks  . Drug use: Not Currently    Types: Marijuana  . Sexual activity: Not on file  Lifestyle  . Physical activity    Days per week: Not on file    Minutes per session: Not on file  . Stress: Not on file  Relationships  . Social Musicianconnections    Talks on phone: Not on file    Gets together: Not on file    Attends religious service: Not on file    Active member of club or organization: Not on file     Attends meetings of clubs or organizations: Not on file    Relationship status: Not on file  Other Topics Concern  . Not on file  Social History Narrative  . Not on file     Family History:  The patient's family history includes Breast cancer in her mother; CAD in her brother, maternal grandmother, and mother; Diabetes in her mother; Emphysema in her father and mother; Heart failure in her brother; Hypertension in her father; Lung cancer in her mother; Mitral valve prolapse in her mother; Prostate cancer in her father.  ROS:   ROS   EKGs/Labs/Other Studies Reviewed:    Studies reviewed were summarized above. The additional studies were reviewed today:  2D Echo 08/28/2018: 1. The left ventricle has normal systolic function with an ejection fraction of 60-65%. The cavity size was mildly dilated. Left ventricular diastolic parameters were normal. No evidence of left ventricular regional wall motion  abnormalities. 2. The right ventricle has normal systolic function. The cavity was normal. There is no increase in right ventricular wall thickness. Right ventricular systolic pressure could not be assessed. 3. The aortic valve is tricuspid. 4. The aorta is normal in size and structure. __________  LHC 09/06/2018:  The left ventricular systolic function is normal.  LV end diastolic pressure is mildly elevated.  The left ventricular ejection fraction is 55-65% by visual estimate.  1. Left dominant system with normal coronary arteries. 2. Normal LV systolic function and mildly elevated left ventricular end-diastolic pressure at 13 mmHg.  Recommendations: Noncardiac chest pain. Continue treatment of risk factors.   EKG:  EKG is ordered today.  The EKG ordered today demonstrates ***  Recent Labs: 02/07/2018: Magnesium 2.0 09/21/2018: ALT 36; BUN 23; Creatinine, Ser 0.93; Hemoglobin 14.5; Platelets 219; Potassium 4.2; Sodium 143; TSH 8.100  Recent Lipid Panel No results found  for: CHOL, TRIG, HDL, CHOLHDL, VLDL, LDLCALC, LDLDIRECT  PHYSICAL EXAM:    VS:  There were no vitals taken for this visit.  BMI: There is no height or weight on file to calculate BMI.  Physical Exam  Wt Readings from Last 3 Encounters:  09/21/18 251 lb (113.9 kg)  09/06/18 245 lb (111.1 kg)  08/15/18 245 lb (111.1 kg)     ASSESSMENT & PLAN:   1. ***  Disposition: F/u with any [facility] MD in ***.   Medication Adjustments/Labs and Tests Ordered: Current medicines are reviewed at length with the patient today.  Concerns regarding medicines are outlined above. Medication changes, Labs and Tests ordered today are summarized above and listed in the Patient Instructions accessible in Encounters.   Signed, Christell Faith, PA-C 11/15/2018 7:57 AM     [facility] [address] [place] [address] [place], [place] [address] [phone]

## 2018-11-19 ENCOUNTER — Ambulatory Visit: Payer: 59 | Admitting: Physician Assistant

## 2018-12-16 ENCOUNTER — Emergency Department: Payer: 59

## 2018-12-16 ENCOUNTER — Encounter: Payer: Self-pay | Admitting: Intensive Care

## 2018-12-16 ENCOUNTER — Emergency Department
Admission: EM | Admit: 2018-12-16 | Discharge: 2018-12-16 | Disposition: A | Discharge status: Home / Self Care | Payer: 59 | Attending: Emergency Medicine | Admitting: Emergency Medicine

## 2018-12-16 ENCOUNTER — Other Ambulatory Visit: Payer: Self-pay

## 2018-12-16 DIAGNOSIS — Z79899 Other long term (current) drug therapy: Secondary | ICD-10-CM | POA: Insufficient documentation

## 2018-12-16 DIAGNOSIS — G43809 Other migraine, not intractable, without status migrainosus: Secondary | ICD-10-CM | POA: Diagnosis not present

## 2018-12-16 DIAGNOSIS — I10 Essential (primary) hypertension: Secondary | ICD-10-CM | POA: Insufficient documentation

## 2018-12-16 DIAGNOSIS — Z7982 Long term (current) use of aspirin: Secondary | ICD-10-CM | POA: Insufficient documentation

## 2018-12-16 DIAGNOSIS — Z8673 Personal history of transient ischemic attack (TIA), and cerebral infarction without residual deficits: Secondary | ICD-10-CM | POA: Insufficient documentation

## 2018-12-16 DIAGNOSIS — J449 Chronic obstructive pulmonary disease, unspecified: Secondary | ICD-10-CM | POA: Diagnosis not present

## 2018-12-16 DIAGNOSIS — G43109 Migraine with aura, not intractable, without status migrainosus: Secondary | ICD-10-CM

## 2018-12-16 DIAGNOSIS — R4701 Aphasia: Secondary | ICD-10-CM | POA: Diagnosis present

## 2018-12-16 DIAGNOSIS — Z87891 Personal history of nicotine dependence: Secondary | ICD-10-CM | POA: Insufficient documentation

## 2018-12-16 LAB — DIFFERENTIAL
Abs Immature Granulocytes: 0.02 10*3/uL (ref 0.00–0.07)
Basophils Absolute: 0 10*3/uL (ref 0.0–0.1)
Basophils Relative: 1 %
Eosinophils Absolute: 0.2 10*3/uL (ref 0.0–0.5)
Eosinophils Relative: 3 %
Immature Granulocytes: 0 %
Lymphocytes Relative: 38 %
Lymphs Abs: 2.1 10*3/uL (ref 0.7–4.0)
Monocytes Absolute: 0.5 10*3/uL (ref 0.1–1.0)
Monocytes Relative: 9 %
Neutro Abs: 2.7 10*3/uL (ref 1.7–7.7)
Neutrophils Relative %: 49 %

## 2018-12-16 LAB — PROTIME-INR
INR: 0.9 (ref 0.8–1.2)
Prothrombin Time: 12 seconds (ref 11.4–15.2)

## 2018-12-16 LAB — COMPREHENSIVE METABOLIC PANEL
ALT: 31 U/L (ref 0–44)
AST: 24 U/L (ref 15–41)
Albumin: 3.7 g/dL (ref 3.5–5.0)
Alkaline Phosphatase: 69 U/L (ref 38–126)
Anion gap: 7 (ref 5–15)
BUN: 16 mg/dL (ref 6–20)
CO2: 22 mmol/L (ref 22–32)
Calcium: 8.7 mg/dL — ABNORMAL LOW (ref 8.9–10.3)
Chloride: 113 mmol/L — ABNORMAL HIGH (ref 98–111)
Creatinine, Ser: 0.81 mg/dL (ref 0.44–1.00)
GFR calc Af Amer: >60 mL/min (ref >60)
GFR calc non Af Amer: >60 mL/min (ref >60)
Glucose, Bld: 116 mg/dL — ABNORMAL HIGH (ref 70–99)
Potassium: 3.7 mmol/L (ref 3.5–5.1)
Sodium: 142 mmol/L (ref 135–145)
Total Bilirubin: 0.6 mg/dL (ref 0.3–1.2)
Total Protein: 6.9 g/dL (ref 6.5–8.1)

## 2018-12-16 LAB — CBC
HCT: 44.9 % (ref 36.0–46.0)
Hemoglobin: 14.8 g/dL (ref 12.0–15.0)
MCH: 28.8 pg (ref 26.0–34.0)
MCHC: 33 g/dL (ref 30.0–36.0)
MCV: 87.5 fL (ref 80.0–100.0)
Platelets: 193 10*3/uL (ref 150–400)
RBC: 5.13 MIL/uL — ABNORMAL HIGH (ref 3.87–5.11)
RDW: 13.8 % (ref 11.5–15.5)
WBC: 5.5 10*3/uL (ref 4.0–10.5)
nRBC: 0 % (ref 0.0–0.2)

## 2018-12-16 LAB — APTT: aPTT: 27 seconds (ref 24–36)

## 2018-12-16 NOTE — ED Triage Notes (Signed)
Last normal yesterday morning 12/15/18 around 12:30pm. Stuttered/slurred speech noted in triage and right sided facial and arm tingling/numbness. Son reported facial droop today but not present in triage. Patient c/o feeling fatigue. Speech difficulty yesterday, weakness, and numbness on right side of body.

## 2018-12-16 NOTE — ED Provider Notes (Signed)
[facility] Emergency Department Provider Note   ____________________________________________    I have reviewed the triage vital signs and the nursing notes.   HISTORY  Chief Complaint Aphasia     HPI Kelly Horton is a 43 y.o. female with a history of complicated migraines and also a history of stroke who presents with complaints of difficulty speaking.  Patient reports yesterday she developed one of her typical migraines which was particularly severe.  This morning she was having difficulty speaking because the right side of her face felt numb.  She states that she thinks she had a facial droop.  She took medication for headache and her headache has greatly improved and now her difficulty speaking and facial droop have also resolved.  She thinks this was likely a complicated migraine.  Feels well currently and has no complaints.  Past Medical History:  Diagnosis Date  . COPD (chronic obstructive pulmonary disease) (HCC)   . DJD (degenerative joint disease)   . Elevated liver function tests   . Essential hypertension   . History of echocardiogram    a.  TTE 08/2018: EF 60 to 65%, mildly dilated LV cavity, normal LV diastolic function, no regional wall motion normalities, normal RV systolic function, normal RV cavity size, trileaflet aortic valve with no significant valvular abnormalities, normal size and structure aorta  . Hyperlipidemia   . Migraine    a.  Complex migraines following stroke in 2003 with strokelike symptoms  . Morbid obesity (HCC)   . Normal coronary arteries    a.  Patient reported MI in 2018 (details unclear); b.  LHC 08/2018: Normal coronary arteries, normal LV SF, mildly elevated LVEDP 13 mmHg  . Stroke [facility]) 2003    Patient Active Problem List   Diagnosis Date Noted  . History of CVA (cerebrovascular accident) 08/15/2018  . Class 3 severe obesity due to excess calories with serious comorbidity and body mass index  (BMI) of 40.0 to 44.9 in adult (HCC) 08/15/2018  . Heart palpitations 08/15/2018  . Chest pain 08/15/2018  . SOB (shortness of breath) 08/15/2018  . Lower extremity edema 08/15/2018  . DDD (degenerative disc disease), cervical 07/30/2015  . Lumbosacral strain 04/03/2015  . Procedure and treatment not carried out because of patient's decision for other reasons 03/26/2015  . Chronic sprain or strain of lumbosacral region 02/05/2014  . Radiculopathy of lumbosacral region 02/05/2014  . Stroke [facility]) 01/17/2001    Past Surgical History:  Procedure Laterality Date  . LEFT HEART CATH AND CORONARY ANGIOGRAPHY N/A 09/06/2018   Procedure: LEFT HEART CATH AND CORONARY ANGIOGRAPHY;  Surgeon: Iran Ouch, MD;  Location: [facility] INVASIVE CV LAB;  Service: Cardiovascular;  Laterality: N/A;    Prior to Admission medications   Medication Sig Start Date End Date Taking? Authorizing Provider  acetaminophen (TYLENOL) 500 MG tablet Take 1,000-1,500 mg by mouth 3 (three) times daily as needed for headache.     [provider]  albuterol (VENTOLIN HFA) 108 (90 Base) MCG/ACT inhaler Inhale 2 puffs into the lungs every 6 (six) hours as needed for wheezing or shortness of breath.    [provider]  aspirin EC 81 MG tablet Take 81 mg by mouth daily.    [provider]  carvedilol (COREG) 6.25 MG tablet Take 6.25 mg by mouth 2 (two) times a day.  06/28/18   [provider]  escitalopram (LEXAPRO) 20 MG tablet Take 20 mg by mouth at bedtime.  06/28/18  [provider]  furosemide (LASIX) 20 MG tablet Take 20 mg by mouth daily as needed for edema.  06/28/18   [provider]  gabapentin (NEURONTIN) 100 MG capsule Take 100 mg by mouth at bedtime.  06/28/18   [provider]  omeprazole (PRILOSEC) 20 MG capsule Take 20 mg by mouth daily.    [provider]  oxymetazoline (AFRIN) 0.05 % nasal spray Place 1 spray into both nostrils 2 (two) times daily  as needed for congestion.    [provider]  rosuvastatin (CRESTOR) 5 MG tablet Take 1 tablet (5 mg total) by mouth daily. 09/21/18 12/20/18  Sondra Bargesunn, Ryan M, PA-C  SPIRIVA HANDIHALER 18 MCG inhalation capsule Place 18 mcg into inhaler and inhale daily as needed (shortness of breath).  06/28/18   [provider]     Allergies Mushroom extract complex, Penicillins, and Shellfish allergy  Family History  Problem Relation Age of Onset  . Diabetes Mother   . Emphysema Mother   . Mitral valve prolapse Mother   . CAD Mother        a.  Reportedly, 4 MIs with the first being in her 7620s and last in her 8060s  . Breast cancer Mother   . Lung cancer Mother   . Hypertension Father   . Emphysema Father   . Prostate cancer Father   . CAD Brother        a.  Reportedly, 3 MIs with the first in his 30s, subsequently passing away at age 45 from heart failure  . Heart failure Brother   . CAD Maternal Grandmother        a.  Four-vessel CABG    Social History Social History   Tobacco Use  . Smoking status: Former Smoker    Packs/day: 2.00    Years: 30.00    Pack years: 60.00    Types: Cigarettes    Quit date: 02/01/2018    Years since quitting: 0.8  . Smokeless tobacco: Never Used  Substance Use Topics  . Alcohol use: No    Alcohol/week: 0.0 standard drinks  . Drug use: Not Currently    Types: Marijuana    Review of Systems  Constitutional: No fever/chills Eyes: No visual changes.  ENT: No difficulty swallowing Cardiovascular: Denies chest pain. Respiratory: Denies shortness of breath. Gastrointestinal: No abdominal pain.  .   Genitourinary: Negative for dysuria. Musculoskeletal: Negative for back pain. Skin: Negative for rash. Neurological: As above   ____________________________________________   PHYSICAL EXAM:  VITAL SIGNS: ED Triage Vitals  Enc Vitals Group     BP 12/16/18 1652 (!) 144/88     Pulse Rate 12/16/18 1652 88     Resp 12/16/18 1652 16     Temp  12/16/18 1648 98.7 F (37.1 C)     Temp Source 12/16/18 1648 Oral     SpO2 12/16/18 1652 98 %     Weight 12/16/18 1648 113.4 kg (250 lb)     Height 12/16/18 1648 1.6 m (5\' 3" )     Head Circumference --      Peak Flow --      Pain Score 12/16/18 1648 4     Pain Loc --      Pain Edu? --      Excl. in GC? --     Constitutional: Alert and oriented.   Nose: No congestion/rhinnorhea.  PERRLA, EOMI Mouth/Throat: Mucous membranes are moist.    Cardiovascular: Normal rate, regular rhythm. Grossly normal heart  sounds.  Good peripheral circulation. Respiratory: Normal respiratory effort.  No retractions. Lungs CTAB. Gastrointestinal: Soft and nontender. No distention.    Musculoskeletal:  Warm and well perfused Neurologic:  Normal speech and language. No gross focal neurologic deficits are appreciated.  Cranial nerves II through XII are normal, normal strength in all extremities, normal ambulation Skin:  Skin is warm, dry and intact. No rash noted. Psychiatric: Mood and affect are normal. Speech and behavior are normal.  ____________________________________________   LABS (all labs ordered are listed, but only abnormal results are displayed)  Labs Reviewed  CBC - Abnormal; Notable for the following components:      Result Value   RBC 5.13 (*)    All other components within normal limits  COMPREHENSIVE METABOLIC PANEL - Abnormal; Notable for the following components:   Chloride 113 (*)    Glucose, Bld 116 (*)    Calcium 8.7 (*)    All other components within normal limits  PROTIME-INR  APTT  DIFFERENTIAL  CBG MONITORING, ED   ____________________________________________  EKG  ED ECG REPORT I, Lavonia Drafts, the attending physician, personally viewed and interpreted this ECG.  Date: 12/16/2018  Rhythm: normal sinus rhythm QRS Axis: Left axis deviation Intervals: normal ST/T Wave abnormalities: normal Narrative Interpretation: no evidence of acute ischemia   ____________________________________________  RADIOLOGY  CT head unremarkable ____________________________________________   PROCEDURES  Procedure(s) performed: No  Procedures   Critical Care performed: No ____________________________________________   INITIAL IMPRESSION / ASSESSMENT AND PLAN / ED COURSE  Pertinent labs & imaging results that were available during my care of the patient were reviewed by me and considered in my medical decision making (see chart for details).  Patient well-appearing in no acute distress, symptoms have entirely resolved.  HPI is most consistent with complicated migraine, would not anticipate any improvement with CVA given the time course.  Imaging is reassuring, lab work is unremarkable.  We discussed her lab and imaging and she is greatly reassured, feel she is appropriate for discharge and she agrees, return precautions discussed    ____________________________________________   FINAL CLINICAL IMPRESSION(S) / ED DIAGNOSES  Final diagnoses:  Complicated migraine        Note:  This document was prepared using Dragon voice recognition software and may include unintentional dictation errors.   Lavonia Drafts, MD 12/16/18 2239

## 2018-12-16 NOTE — ED Triage Notes (Signed)
First RN Note: Pt presents to ED via POV with c/o slurred speech, severe fatigue, and per patient her son told her that she had facial droop. Pt presents to ED drinking soda upon arrival, refused wheelchair upon arrival to ED.

## 2018-12-28 ENCOUNTER — Other Ambulatory Visit: Admission: RE | Admit: 2018-12-28 | Payer: 59 | Source: Ambulatory Visit

## 2019-01-02 ENCOUNTER — Ambulatory Visit: Payer: 59 | Attending: Neurology

## 2019-01-02 DIAGNOSIS — G4733 Obstructive sleep apnea (adult) (pediatric): Secondary | ICD-10-CM | POA: Diagnosis present

## 2019-03-02 ENCOUNTER — Emergency Department: Payer: 59

## 2019-03-02 ENCOUNTER — Emergency Department
Admission: EM | Admit: 2019-03-02 | Discharge: 2019-03-02 | Disposition: A | Discharge status: Home / Self Care | Payer: 59 | Attending: Emergency Medicine | Admitting: Emergency Medicine

## 2019-03-02 ENCOUNTER — Encounter: Payer: Self-pay | Admitting: Intensive Care

## 2019-03-02 ENCOUNTER — Other Ambulatory Visit: Payer: Self-pay

## 2019-03-02 DIAGNOSIS — R0789 Other chest pain: Secondary | ICD-10-CM | POA: Diagnosis not present

## 2019-03-02 DIAGNOSIS — R070 Pain in throat: Secondary | ICD-10-CM | POA: Diagnosis not present

## 2019-03-02 DIAGNOSIS — J189 Pneumonia, unspecified organism: Secondary | ICD-10-CM

## 2019-03-02 DIAGNOSIS — Z7982 Long term (current) use of aspirin: Secondary | ICD-10-CM | POA: Insufficient documentation

## 2019-03-02 DIAGNOSIS — I10 Essential (primary) hypertension: Secondary | ICD-10-CM | POA: Insufficient documentation

## 2019-03-02 DIAGNOSIS — Z20822 Contact with and (suspected) exposure to covid-19: Secondary | ICD-10-CM | POA: Insufficient documentation

## 2019-03-02 DIAGNOSIS — Z79899 Other long term (current) drug therapy: Secondary | ICD-10-CM | POA: Insufficient documentation

## 2019-03-02 DIAGNOSIS — R05 Cough: Secondary | ICD-10-CM | POA: Diagnosis present

## 2019-03-02 DIAGNOSIS — Z87891 Personal history of nicotine dependence: Secondary | ICD-10-CM | POA: Insufficient documentation

## 2019-03-02 DIAGNOSIS — J449 Chronic obstructive pulmonary disease, unspecified: Secondary | ICD-10-CM | POA: Diagnosis not present

## 2019-03-02 HISTORY — DX: Heart failure, unspecified: I50.9

## 2019-03-02 LAB — CBC
HCT: 46.7 % — ABNORMAL HIGH (ref 36.0–46.0)
Hemoglobin: 15.4 g/dL — ABNORMAL HIGH (ref 12.0–15.0)
MCH: 29.2 pg (ref 26.0–34.0)
MCHC: 33 g/dL (ref 30.0–36.0)
MCV: 88.6 fL (ref 80.0–100.0)
Platelets: 217 10*3/uL (ref 150–400)
RBC: 5.27 MIL/uL — ABNORMAL HIGH (ref 3.87–5.11)
RDW: 14.2 % (ref 11.5–15.5)
WBC: 4.6 10*3/uL (ref 4.0–10.5)
nRBC: 0 % (ref 0.0–0.2)

## 2019-03-02 LAB — BASIC METABOLIC PANEL
Anion gap: 9 (ref 5–15)
BUN: 21 mg/dL — ABNORMAL HIGH (ref 6–20)
CO2: 27 mmol/L (ref 22–32)
Calcium: 9.8 mg/dL (ref 8.9–10.3)
Chloride: 106 mmol/L (ref 98–111)
Creatinine, Ser: 0.78 mg/dL (ref 0.44–1.00)
GFR calc Af Amer: >60 mL/min (ref >60)
GFR calc non Af Amer: >60 mL/min (ref >60)
Glucose, Bld: 97 mg/dL (ref 70–99)
Potassium: 3.8 mmol/L (ref 3.5–5.1)
Sodium: 142 mmol/L (ref 135–145)

## 2019-03-02 LAB — TROPONIN I (HIGH SENSITIVITY)
Troponin I (High Sensitivity): 2 ng/L (ref <18)
Troponin I (High Sensitivity): <2 ng/L (ref <18)

## 2019-03-02 MED ORDER — PREDNISONE 20 MG PO TABS: 60.0000 mg | ORAL_TABLET | Freq: Every day | ORAL | 0 refills | Status: AC

## 2019-03-02 MED ORDER — IPRATROPIUM-ALBUTEROL 0.5-2.5 (3) MG/3ML IN SOLN
3.0000 mL | Freq: Once | RESPIRATORY_TRACT | Status: AC
  Administered 2019-03-02: 3 mL via RESPIRATORY_TRACT
  Filled 2019-03-02: qty 3

## 2019-03-02 MED ORDER — PREDNISONE 20 MG PO TABS
60.0000 mg | ORAL_TABLET | Freq: Once | ORAL | Status: AC
  Administered 2019-03-02: 60 mg via ORAL
  Filled 2019-03-02: qty 3

## 2019-03-02 MED ORDER — ALBUTEROL SULFATE HFA 108 (90 BASE) MCG/ACT IN AERS: 2.0000 | INHALATION_SPRAY | Freq: Four times a day (QID) | RESPIRATORY_TRACT | 0 refills | Status: DC | PRN

## 2019-03-02 MED ORDER — AZITHROMYCIN 250 MG PO TABS: ORAL_TABLET | ORAL | 0 refills | Status: AC

## 2019-03-02 NOTE — ED Notes (Signed)
Pt states she is here for raspy throat and chest heaviness. Labs previously drawn. Has an appt with pcp on weds

## 2019-03-02 NOTE — ED Triage Notes (Signed)
Patient c/o congestion, cough, chest pressure described as "elephant sitting on chest" and sore throat. HX CHF. Reports she doubled up on her fluid pills today.

## 2019-03-02 NOTE — Discharge Instructions (Signed)
Take the antibiotic and the prednisone as prescribed and finish the full course of both.  You should use albuterol up to every 4-6 hours as needed for shortness of breath or chest tightness.  Return to the ER for new, worsening or persistent chest pain, shortness of breath, cough, fever, weakness, or any other new or worsening symptoms that concern you.

## 2019-03-02 NOTE — ED Notes (Signed)
First Nurse Note: Pt to ED via POV c/o sore throat and dry cough. Pt has hx/o CHF. Pt is in NAD, speaking in complete sentences.

## 2019-03-02 NOTE — ED Provider Notes (Signed)
[facility] Emergency Department Provider Note ____________________________________________   First MD Initiated Contact with Patient 03/02/19 1531     (approximate)  I have reviewed the triage vital signs and the nursing notes.   HISTORY  Chief Complaint Cough, Chest Pain, and Sore Throat    HPI Kelly Horton is a 44 y.o. female with PMH as noted below including COPD who presents with a nonproductive cough over the last several days, gradual onset, and associated with heaviness in her chest as well as a sore throat.  The patient denies significant shortness of breath or any fever or chills.  She has nausea but no vomiting or diarrhea.  She reports that she has doubled her water pill in the last several days without significant improvement.  Past Medical History:  Diagnosis Date  . CHF (congestive heart failure) (HCC)   . COPD (chronic obstructive pulmonary disease) (HCC)   . DJD (degenerative joint disease)   . Elevated liver function tests   . Essential hypertension   . History of echocardiogram    a.  TTE 08/2018: EF 60 to 65%, mildly dilated LV cavity, normal LV diastolic function, no regional wall motion normalities, normal RV systolic function, normal RV cavity size, trileaflet aortic valve with no significant valvular abnormalities, normal size and structure aorta  . Hyperlipidemia   . Migraine    a.  Complex migraines following stroke in 2003 with strokelike symptoms  . Morbid obesity (HCC)   . Normal coronary arteries    a.  Patient reported MI in 2018 (details unclear); b.  LHC 08/2018: Normal coronary arteries, normal LV SF, mildly elevated LVEDP 13 mmHg  . Stroke [facility]) 2003    Patient Active Problem List   Diagnosis Date Noted  . History of CVA (cerebrovascular accident) 08/15/2018  . Class 3 severe obesity due to excess calories with serious comorbidity and body mass index (BMI) of 40.0 to 44.9 in adult (HCC) 08/15/2018  . Heart  palpitations 08/15/2018  . Chest pain 08/15/2018  . SOB (shortness of breath) 08/15/2018  . Lower extremity edema 08/15/2018  . DDD (degenerative disc disease), cervical 07/30/2015  . Lumbosacral strain 04/03/2015  . Procedure and treatment not carried out because of patient's decision for other reasons 03/26/2015  . Chronic sprain or strain of lumbosacral region 02/05/2014  . Radiculopathy of lumbosacral region 02/05/2014  . Stroke [facility]) 01/17/2001    Past Surgical History:  Procedure Laterality Date  . LEFT HEART CATH AND CORONARY ANGIOGRAPHY N/A 09/06/2018   Procedure: LEFT HEART CATH AND CORONARY ANGIOGRAPHY;  Surgeon: Iran Ouch, MD;  Location: [facility] INVASIVE CV LAB;  Service: Cardiovascular;  Laterality: N/A;    Prior to Admission medications   Medication Sig Start Date End Date Taking? Authorizing Provider  acetaminophen (TYLENOL) 500 MG tablet Take 1,000-1,500 mg by mouth 3 (three) times daily as needed for headache.     [provider]  albuterol (VENTOLIN HFA) 108 (90 Base) MCG/ACT inhaler Inhale 2 puffs into the lungs every 6 (six) hours as needed for wheezing or shortness of breath. 03/02/19   Dionne Bucy, MD  aspirin EC 81 MG tablet Take 81 mg by mouth daily.    [provider]  azithromycin (ZITHROMAX Z-PAK) 250 MG tablet Take 2 tablets (500 mg) on  Day 1,  followed by 1 tablet (250 mg) once daily on Days 2 through 5. 03/02/19 03/07/19  Dionne Bucy, MD  carvedilol (COREG) 6.25 MG tablet Take 6.25 mg by  mouth 2 (two) times a day.  06/28/18   [provider]  escitalopram (LEXAPRO) 20 MG tablet Take 20 mg by mouth at bedtime.  06/28/18   [provider]  furosemide (LASIX) 20 MG tablet Take 20 mg by mouth daily as needed for edema.  06/28/18   [provider]  gabapentin (NEURONTIN) 100 MG capsule Take 100 mg by mouth at bedtime.  06/28/18   [provider]  omeprazole (PRILOSEC) 20 MG capsule Take 20 mg by  mouth daily.    [provider]  oxymetazoline (AFRIN) 0.05 % nasal spray Place 1 spray into both nostrils 2 (two) times daily as needed for congestion.    [provider]  predniSONE (DELTASONE) 20 MG tablet Take 3 tablets (60 mg total) by mouth daily for 4 days. Start the day after the ER visit 03/02/19 03/06/19  Dionne Bucy, MD  rosuvastatin (CRESTOR) 5 MG tablet Take 1 tablet (5 mg total) by mouth daily. 09/21/18 12/20/18  Sondra Barges, PA-C  SPIRIVA HANDIHALER 18 MCG inhalation capsule Place 18 mcg into inhaler and inhale daily as needed (shortness of breath).  06/28/18   [provider]    Allergies Mushroom extract complex, Penicillins, and Shellfish allergy  Family History  Problem Relation Age of Onset  . Diabetes Mother   . Emphysema Mother   . Mitral valve prolapse Mother   . CAD Mother        a.  Reportedly, 4 MIs with the first being in her 59s and last in her 32s  . Breast cancer Mother   . Lung cancer Mother   . Hypertension Father   . Emphysema Father   . Prostate cancer Father   . CAD Brother        a.  Reportedly, 3 MIs with the first in his 30s, subsequently passing away at age 36 from heart failure  . Heart failure Brother   . CAD Maternal Grandmother        a.  Four-vessel CABG    Social History Social History   Tobacco Use  . Smoking status: Former Smoker    Packs/day: 2.00    Years: 30.00    Pack years: 60.00    Types: Cigarettes    Quit date: 02/01/2018    Years since quitting: 1.0  . Smokeless tobacco: Never Used  Substance Use Topics  . Alcohol use: No    Alcohol/week: 0.0 standard drinks  . Drug use: Not Currently    Types: Marijuana    Review of Systems  Constitutional: No fever/chills. Eyes: No redness. ENT: Positive for sore throat. Cardiovascular: Positive for chest pain. Respiratory: Denies shortness of breath. Gastrointestinal: Positive for nausea.  No vomiting or diarrhea.  Genitourinary: Negative  for dysuria.  Musculoskeletal: Negative for back pain. Skin: Negative for rash. Neurological: Negative for headache.   ____________________________________________   PHYSICAL EXAM:  VITAL SIGNS: ED Triage Vitals  Enc Vitals Group     BP 03/02/19 1234 123/79     Pulse Rate 03/02/19 1234 89     Resp 03/02/19 1234 20     Temp 03/02/19 1234 97.9 F (36.6 C)     Temp Source 03/02/19 1234 Oral     SpO2 03/02/19 1233 95 %     Weight 03/02/19 1234 260 lb (117.9 kg)     Height 03/02/19 1234 5\' 3"  (1.6 m)     Head Circumference --      Peak Flow --  Pain Score 03/02/19 1234 2     Pain Loc --      Pain Edu? --      Excl. in [place]? --     Constitutional: Alert and oriented. Well appearing and in no acute distress. Eyes: Conjunctivae are normal.  Head: Atraumatic. Nose: No congestion/rhinnorhea. Mouth/Throat: Mucous membranes are moist.  Oropharynx clear with mild erythema but no exudates or swelling. Neck: Normal range of motion.  Cardiovascular: Normal rate, regular rhythm. Grossly normal heart sounds.  Good peripheral circulation. Respiratory: Normal respiratory effort.  No retractions.  Slightly decreased breath sounds bilaterally. Gastrointestinal: No distention.  Genitourinary: No flank tenderness. Musculoskeletal: No lower extremity edema.  Extremities warm and well perfused.  Neurologic:  Normal speech and language. No gross focal neurologic deficits are appreciated.  Skin:  Skin is warm and dry. No rash noted. Psychiatric: Mood and affect are normal. Speech and behavior are normal.  ____________________________________________   LABS (all labs ordered are listed, but only abnormal results are displayed)  Labs Reviewed  BASIC METABOLIC PANEL - Abnormal; Notable for the following components:      Result Value   BUN 21 (*)    All other components within normal limits  CBC - Abnormal; Notable for the following components:   RBC 5.27 (*)    Hemoglobin 15.4 (*)     HCT 46.7 (*)    All other components within normal limits  NOVEL CORONAVIRUS, NAA (HOSP ORDER, SEND-OUT TO REF LAB; TAT 18-24 HRS)  TROPONIN I (HIGH SENSITIVITY)  TROPONIN I (HIGH SENSITIVITY)   ____________________________________________  EKG  ED ECG REPORT I, Arta Silence, the attending physician, personally viewed and interpreted this ECG.  Date: 03/02/2019 EKG Time: 1240 Rate: 95 Rhythm: normal sinus rhythm QRS Axis: normal Intervals: LAFB ST/T Wave abnormalities: normal Narrative Interpretation: no evidence of acute ischemia  ____________________________________________  RADIOLOGY  CXR: Possible patchy opacity in left lung base  ____________________________________________   PROCEDURES  Procedure(s) performed: No  Procedures  Critical Care performed: No ____________________________________________   INITIAL IMPRESSION / ASSESSMENT AND PLAN / ED COURSE  Pertinent labs & imaging results that were available during my care of the patient were reviewed by me and considered in my medical decision making (see chart for details).  44 year old female with PMH as noted above presents with cough, sore throat, and chest pressure over the last several days which has not responded to her increasing her diuretic.  The patient has a history of COPD.  I reviewed the past medical records in Epic; the patient was last seen in the ED several months ago for unrelated symptoms.  She has had no admissions in the last few years.  On exam she is overall well-appearing and her vital signs are normal.  During my exam, the O2 saturation was in the high 90s on room air.  The patient has somewhat diminished breath sounds bilaterally but no other significant exam findings.  The oropharynx is clear.  Initial lab work-up is unremarkable.  The first troponin is negative.  EKG is nonischemic.  Chest x-ray shows a possible left lower lobe infiltrate.  Presentation is overall most  consistent with pneumonia versus acute bronchitis or COPD exacerbation.  I have a low suspicion for Covid.  We will give duo nebs and steroid, obtain a second troponin, and if negative plan for discharge with an oral antibiotic.   ----------------------------------------- 8:09 PM on 03/02/2019 -----------------------------------------  Repeat troponin was negative, and the patient experienced significant relief after nebs and  steroid and wanted to go home.  I discharged her with prescriptions for albuterol, prednisone, and azithromycin.  Return precautions were given, and she expressed understanding.  ____________________________  Clovia Cuff was evaluated in Emergency Department on 03/02/2019 for the symptoms described in the history of present illness. She was evaluated in the context of the global COVID-19 pandemic, which necessitated consideration that the patient might be at risk for infection with the SARS-CoV-2 virus that causes COVID-19. Institutional protocols and algorithms that pertain to the evaluation of patients at risk for COVID-19 are in a state of rapid change based on information released by regulatory bodies including the CDC and federal and state organizations. These policies and algorithms were followed during the patient's care in the ED. ____________________________________________   FINAL CLINICAL IMPRESSION(S) / ED DIAGNOSES  Final diagnoses:  Community acquired pneumonia of left lower lobe of lung      NEW MEDICATIONS STARTED DURING THIS VISIT:  Discharge Medication List as of 03/02/2019  5:35 PM    START taking these medications   Details  azithromycin (ZITHROMAX Z-PAK) 250 MG tablet Take 2 tablets (500 mg) on  Day 1,  followed by 1 tablet (250 mg) once daily on Days 2 through 5., Normal    predniSONE (DELTASONE) 20 MG tablet Take 3 tablets (60 mg total) by mouth daily for 4 days. Start the day after the ER visit, Starting Sat 03/02/2019, Until Wed  03/06/2019, Normal         Note:  This document was prepared using Dragon voice recognition software and may include unintentional dictation errors.    Dionne Bucy, MD 03/02/19 2009

## 2019-03-03 LAB — NOVEL CORONAVIRUS, NAA (HOSP ORDER, SEND-OUT TO REF LAB; TAT 18-24 HRS): SARS-CoV-2, NAA: NOT DETECTED

## 2019-03-18 ENCOUNTER — Encounter: Payer: Self-pay | Admitting: Family

## 2019-03-18 ENCOUNTER — Other Ambulatory Visit: Payer: Self-pay

## 2019-03-18 ENCOUNTER — Ambulatory Visit (INDEPENDENT_AMBULATORY_CARE_PROVIDER_SITE_OTHER): Payer: 59 | Admitting: Family

## 2019-03-18 VITALS — BP 114/78 | HR 86 | Ht 63.0 in | Wt 257.5 lb

## 2019-03-18 DIAGNOSIS — R600 Localized edema: Secondary | ICD-10-CM

## 2019-03-18 DIAGNOSIS — R5383 Other fatigue: Secondary | ICD-10-CM | POA: Diagnosis not present

## 2019-03-18 DIAGNOSIS — E785 Hyperlipidemia, unspecified: Secondary | ICD-10-CM

## 2019-03-18 DIAGNOSIS — Z8673 Personal history of transient ischemic attack (TIA), and cerebral infarction without residual deficits: Secondary | ICD-10-CM

## 2019-03-18 DIAGNOSIS — G4733 Obstructive sleep apnea (adult) (pediatric): Secondary | ICD-10-CM

## 2019-03-18 DIAGNOSIS — R002 Palpitations: Secondary | ICD-10-CM | POA: Diagnosis not present

## 2019-03-18 MED ORDER — ROSUVASTATIN CALCIUM 5 MG PO TABS: 5.0000 mg | ORAL_TABLET | Freq: Every day | ORAL | 1 refills | Status: DC

## 2019-03-18 NOTE — Progress Notes (Signed)
Office Visit    Patient Name: Kelly Horton Date of Encounter: 03/18/2019  Primary Care Provider:  [facility] Primary Cardiologist:  Jodelle Red, MD Electrophysiologist:  None   Chief Complaint    Kelly Horton is a 44 y.o. female with a hx of normal coronary arteries by cath 08/2018, CVA 2003 felt to be stress related taking 2 years to recover with intermittent complex migraines and strokelike symptoms following the episode, reported prior MI 2018 in [place] without caridac cath, COPD secondary to prior tobacco abuse with 60 pack year history, HTN, HLD, elevated liver function testing, DJD, neuropathy, morbid obesity, reported family hx of premature coronary artery disease with mother with 4 MIs (first in her early 31s and last in her late 69s, maternal grandmother with 4-vessel CABG, maternal uncle with CABD, brother with 3 MIs first in his early 41s and passing at age 70 from heart failure). She presents today for palpitations, chest pain, leg swelling, fatigue.    Past Medical History    Past Medical History:  Diagnosis Date  . CHF (congestive heart failure) (HCC)   . COPD (chronic obstructive pulmonary disease) (HCC)   . DJD (degenerative joint disease)   . Elevated liver function tests   . Essential hypertension   . History of echocardiogram    a.  TTE 08/2018: EF 60 to 65%, mildly dilated LV cavity, normal LV diastolic function, no regional wall motion normalities, normal RV systolic function, normal RV cavity size, trileaflet aortic valve with no significant valvular abnormalities, normal size and structure aorta  . Hyperlipidemia   . Migraine    a.  Complex migraines following stroke in 2003 with strokelike symptoms  . Morbid obesity (HCC)   . Normal coronary arteries    a.  Patient reported MI in 2018 (details unclear); b.  LHC 08/2018: Normal coronary arteries, normal LV SF, mildly elevated LVEDP 13 mmHg  . Stroke [facility]) 2003    Past Surgical History:  Procedure Laterality Date  . LEFT HEART CATH AND CORONARY ANGIOGRAPHY N/A 09/06/2018   Procedure: LEFT HEART CATH AND CORONARY ANGIOGRAPHY;  Surgeon: Iran Ouch, MD;  Location: [facility] INVASIVE CV LAB;  Service: Cardiovascular;  Laterality: N/A;    Allergies  Allergies  Allergen Reactions  . Mushroom Extract Complex Anaphylaxis  . Penicillins Hives    Did it involve swelling of the face/tongue/throat, SOB, or low BP? No Did it involve sudden or severe rash/hives, skin peeling, or any reaction on the inside of your mouth or nose? No Did you need to seek medical attention at a hospital or doctor's office? No When did it last happen?20+ years If all above answers are "NO", may proceed with cephalosporin use.   . Shellfish Allergy Swelling    History of Present Illness    Kelly Horton is a 44 y.o. female with a hx of hx of normal coronary arteries by cath 08/2018, CVA 2003 felt to be stress related taking 2 years to recover with intermittent complex migraines and strokelike symptoms following the episode, reported prior MI 2018 in [place] without caridac cath, COPD secondary to prior tobacco abuse with 60 pack year history, HTN, HLD, elevated liver function testing, DJD, neuropathy, morbid obesity, reported family hx of premature coronary artery disease with mother with 4 MIs (first in her early 78s and last in her late 79s, maternal grandmother with 4-vessel CABG, maternal uncle with CABD, brother with 3 MIs first in his early 42s  and passing at age 34 from heart failure). She was last seen 09/2018 by Eula Listen, PA.  Seen as new patient 08/15/2018 by Dr. Cristal Deer. Multiple complaints included intermittent chest pain worse when laying flat, LE edema for 2-3 years, palpitations. Echo 08/28/18 with LVEF 60-65%, mildly dilated LV, normal LV diastolic function, no RWMA, normal RV size and function, trileaflet AV, normal size and structure aorta.  Diagnostic cath 08/2018 with normal coronary arteries, normal LV systolic function, and mildly elevated LVEDP at . Chest pain felt to be non cardiac. ZIO monitor 09/2018 for palpitations was only worn for 2.5 days, but showed SR with rare isolated PVC/PAC. 1 short burst run of tachycardia noted which was too short to define. Triggered events not associated with arrhythmia. She did have a TSH of 8.1 on 09/21/18 and was recommended to follow up with her PCP.  At last office visit ZIO was recommended for palpitations. She was transitioned from Atorvastatin to Rosuvastatin due to myalgias.   Seen in the ED 03/02/19 and diagnosed with pneumonia. She was COVID negative. Troponin negative x2. Normal renal fynction and electrolytes. Hb 15.4. She was sent home with albuterol, prednisone, and azithromycin.    Reports chest pain radiating into left arm, shortness of breath, easy bruising, fatigue, swelling in arms and legs.    Tells me she is having pain in her left arm going down her L shoulder.  This occurs both at rest and with activity.  Not associated with shortness of breath nor diaphoresis.  We reviewed her cardiac catheterization from August which showed clean coronary arteries.  Low suspicion cardiac origin.  Discussed possibility of anxiety precipitated versus acid reflux versus musculoskeletal.  Tells me she is tired all the time. Tells me this has been worsening over the last 3 months. She was started on CPAP in January and is still waking up tired.  Told by PCP recently to that she was vitamin D deficient and prescription was sent though she has not picked up.  Recommend she start and discussed the possible role of vitamin D deficiency and her fatigue.  Her legs have been hurting and swelling.  We reviewed her echocardiogram showed normal pumping function.  Discussed likely etiology of venous insufficiency. Reports high sodium intake, but recently has made changes.   She was told by her primary  care doctor that she had an irregular heart beat. Tells me she was told this while the PCP was listening. We reviewed EKG today which shows NSR. She previously worse a monitor for 3 days tells me she had to remove due to blisters. We reviewed results in detail showing rare isolated PVC/PAC (<1%) and one run of SVT.   We discussed triggers of palpitations including caffeine -she primarily drinks Dr. Reino Kent and sweet tea throughout the day.  She was agreeable to reduce her caffeine intake.  Also discussed triggers of stress which she endorses lots of recently.  Also discussed triggers of dehydration-we discussed that her tea and Dr. Reino Kent both contain caffeine which is a diuretic and may contribute to dehydration.  She is taking her fluid pill 40mg  in the am and 40mg  in the afternoon. This was increased by her primary care doctor.   EKGs/Labs/Other Studies Reviewed:   The following studies were reviewed today:  ZIO 09/2018 Patient had a min HR of 60 bpm, max HR of 134 bpm, and avg HR of 92 bpm. Predominant underlying rhythm was Sinus Rhythm. 1 run of Ventricular Tachycardia occurred lasting 4 beats  with a max rate of 109 bpm (avg 103 bpm). Isolated SVEs were rare (<1.0%), SVE Couplets were rare (<1.0%), and SVE Triplets were rare (<1.0%). Isolated VEs were rare (<1.0%), and no VE Couplets or VE Triplets were present.  EKG:  EKG is ordered today.  The ekg ordered today demonstrates SR 86 bpm with no acute ST/T wave changes.   Recent Labs: 09/21/2018: TSH 8.100 12/16/2018: ALT 31 03/02/2019: BUN 21; Creatinine, Ser 0.78; Hemoglobin 15.4; Platelets 217; Potassium 3.8; Sodium 142  Recent Lipid Panel No results found for: CHOL, TRIG, HDL, CHOLHDL, VLDL, LDLCALC, LDLDIRECT  Home Medications   No outpatient medications have been marked as taking for the 03/18/19 encounter (Appointment) with Alver Sorrow, NP.    Review of Systems      Review of Systems  Constitution: Positive for  malaise/fatigue. Negative for chills and fever.  Cardiovascular: Positive for chest pain, dyspnea on exertion, leg swelling and palpitations. Negative for irregular heartbeat, near-syncope, orthopnea and syncope.  Respiratory: Positive for shortness of breath. Negative for cough and wheezing.   Gastrointestinal: Negative for melena, nausea and vomiting.  Genitourinary: Negative for hematuria.  Neurological: Negative for dizziness, light-headedness and weakness.   All other systems reviewed and are otherwise negative except as noted above.  Physical Exam    VS:  There were no vitals taken for this visit. , BMI There is no height or weight on file to calculate BMI. GEN: Well nourished, well developed, in no acute distress. HEENT: normal. Neck: Supple, no JVD, carotid bruits, or masses. Cardiac: RRR, no murmurs, rubs, or gallops. No clubbing, cyanosis, edema.  Radials/PT 2+ and equal bilaterally.  Respiratory:  Respirations regular and unlabored, clear to auscultation bilaterally. GI: Soft, nontender, nondistended, BS + x 4. MS: No deformity or atrophy. Skin: Warm and dry, no rash. Neuro:  Strength and sensation are intact. Psych: Normal affect.  Accessory Clinical Findings    ECG personally reviewed by me today- SR 86 bpm  - no acute changes.  Assessment & Plan    1. Normal coronary arteries by cath 08/2018 - Continued intermittent episodes of chest pain. Noncardiac in origin - consider etiology musculoskeletal vs anxiety. Reassurance provided.  2. Palpitations - EKG today NSR. Echo 08/2018 with normal LVEF, normal bilateral atrium. 3 day ZIO (removed due to reported blisters) with SR, rare isolated PVC/PAC, 1 short episode SVT. Reports increased palpitations with sensation of heart "racing" over last few weeks. Anxiety and pneumonia treatment inluding prednisone likely contributory. Continue Coreg 6.25 BID. Recommend avoiding caffeine and remaining well hydrated. Thyroid panel today - she  will follow up with her primary care regarding this.   3. LE edema - Echo 08/2018 with normal LVEF. LE edema likely venous insufficiency. Discussed that gabapentin also may be contributory. Recommend elevating LE, low sodium diet, compression stockings or diabetic socks.   4. Hx of CVA - Uncertain etiology. ZIO with no atrial fib/flutter. Continue aspirin and statin.   5. HLD - LDL goal <70 in setting of previous CVA. Reports myalgias with Atorvastatin. Previously started on Crestor by Eula Listen at previous office visit but has run out - we will refill. Repeat labs by her PCP.   6. HTN - BP well controlled. Continue Coreg 6.25 mg BID.  7. COPD - Likely etiology of her shortness of breath and dyspnea. Continue inhalers as prescribed by her PCP.   8. Fatigue - Likely multifactorial deconditioning and low vitamin D. Her TSH was severely elevated in September, but  she did not follow up with her PCP. Hypothyroidism could also be contributory. Thyroid panel today and we will forward results to her PCP. Encouraged her to pick up Vitamin D prescription her PCP sent.  9. OSA - Reports compliance with CPAP.   Disposition: Follow up in 1 year(s) - to establish with new primary cardiologist in [place] at that time or follow up with Dr. Harrell Gave in [place].    Loel Dubonnet, NP 03/18/2019, 8:23 AM

## 2019-03-18 NOTE — Patient Instructions (Addendum)
Medication Instructions:  Your physician has recommended you make the following change in your medication:   STOP Atorvastatin   START Crestor Take 1 tablet (5 mg total) by mouth daily  This hopefully will help with your muscle pains.   *If you need a refill on your cardiac medications before your next appointment, please call your pharmacy*  Lab Work: Your physician recommends that you return for lab work today: Thyroid panel  If you have labs (blood work) drawn today and your tests are completely normal, you will receive your results only by: Marland Kitchen MyChart Message (if you have MyChart) OR . A paper copy in the mail If you have any lab test that is abnormal or we need to change your treatment, we will call you to review the results.  Testing/Procedures: Your EKG today shows normal sinus rhythm which is a good result.  Your ultrasound of your heart showed a normal pumping function and normal heart valves.   The cardiac catheterization showed no evidence of heart disease.   Follow-Up: At [facility], you and your health needs are our priority.  As part of our continuing mission to provide you with exceptional heart care, we have created designated Provider Care Teams.  These Care Teams include your primary Cardiologist (physician) and Advanced Practice Providers (APPs -  Physician Assistants and Nurse Practitioners) who all work together to provide you with the care you need, when you need it.  We recommend signing up for the patient portal called "MyChart".  Sign up information is provided on this After Visit Summary.  MyChart is used to connect with patients for Virtual Visits (Telemedicine).  Patients are able to view lab/test results, encounter notes, upcoming appointments, etc.  Non-urgent messages can be sent to your provider as well.   To learn more about what you can do with MyChart, go to [email or web address].    Your next appointment:  Please establish primary cardiologist  at your next appt in 1 year.   Other Instructions   Recommend limiting your salt intake. Note that Dr. Reino Kent has a lot of salt in it.    Recommend switching to caffeine-free beverages. The caffeine can make you dehydrated.   Recommend calling primary care about your thyroid   Your swelling is likely due to venous insufficiency and gabapentin. Try wearing compression stockings or diabetic socks.

## 2019-03-19 ENCOUNTER — Telehealth: Payer: Self-pay

## 2019-03-19 LAB — THYROID PANEL WITH TSH
Free Thyroxine Index: 1.8 (ref 1.2–4.9)
T3 Uptake Ratio: 25 % (ref 24–39)
T4, Total: 7 ug/dL (ref 4.5–12.0)
TSH: 1.27 u[IU]/mL (ref 0.450–4.500)

## 2019-03-19 NOTE — Telephone Encounter (Signed)
Spoke with patient regarding results and Gillian Shields, NP recommendation to start Vitamin D as prescribed by her PCP.  Patient verbalizes understanding and is agreeable to plan of care. Advised patient to call back with any issues or concerns.

## 2019-03-19 NOTE — Telephone Encounter (Signed)
-----   Message from Alver Sorrow, NP sent at 03/19/2019  7:41 AM EST ----- Normal thyroid function. Numbers much improved from previous! Thyroid function not contributing to fatigue. Recommend she start Vitamin D supplement as prescribed by her PCP.

## 2019-04-29 ENCOUNTER — Other Ambulatory Visit (HOSPITAL_COMMUNITY): Payer: Self-pay | Admitting: Emergency Medicine

## 2019-04-29 ENCOUNTER — Other Ambulatory Visit (HOSPITAL_COMMUNITY): Payer: Self-pay | Admitting: Family Medicine

## 2019-04-29 DIAGNOSIS — R609 Edema, unspecified: Secondary | ICD-10-CM

## 2019-04-29 DIAGNOSIS — W19XXXA Unspecified fall, initial encounter: Secondary | ICD-10-CM

## 2019-10-04 ENCOUNTER — Other Ambulatory Visit: Payer: Self-pay | Admitting: Surgery

## 2019-10-04 DIAGNOSIS — S83271S Complex tear of lateral meniscus, current injury, right knee, sequela: Secondary | ICD-10-CM

## 2019-10-04 DIAGNOSIS — M1711 Unilateral primary osteoarthritis, right knee: Secondary | ICD-10-CM

## 2019-12-15 ENCOUNTER — Ambulatory Visit (INDEPENDENT_AMBULATORY_CARE_PROVIDER_SITE_OTHER): Payer: 59

## 2019-12-15 ENCOUNTER — Ambulatory Visit
Admission: EM | Admit: 2019-12-15 | Discharge: 2019-12-15 | Disposition: A | Discharge status: Home / Self Care | Payer: 59 | Attending: Emergency Medicine | Admitting: Emergency Medicine

## 2019-12-15 ENCOUNTER — Encounter: Payer: Self-pay | Admitting: Emergency Medicine

## 2019-12-15 ENCOUNTER — Other Ambulatory Visit: Payer: Self-pay

## 2019-12-15 DIAGNOSIS — Z0000 Encounter for general adult medical examination without abnormal findings: Secondary | ICD-10-CM

## 2019-12-15 DIAGNOSIS — I10 Essential (primary) hypertension: Secondary | ICD-10-CM

## 2019-12-15 DIAGNOSIS — R0789 Other chest pain: Secondary | ICD-10-CM

## 2019-12-15 DIAGNOSIS — E785 Hyperlipidemia, unspecified: Secondary | ICD-10-CM

## 2019-12-15 DIAGNOSIS — R0602 Shortness of breath: Secondary | ICD-10-CM

## 2019-12-15 DIAGNOSIS — J189 Pneumonia, unspecified organism: Secondary | ICD-10-CM

## 2019-12-15 MED ORDER — FUROSEMIDE 20 MG PO TABS: 20.0000 mg | ORAL_TABLET | Freq: Every day | ORAL | 0 refills | Status: AC | PRN

## 2019-12-15 MED ORDER — AMLODIPINE BESYLATE 2.5 MG PO TABS: 2.5000 mg | ORAL_TABLET | Freq: Every day | ORAL | 1 refills | Status: AC

## 2019-12-15 MED ORDER — GABAPENTIN 100 MG PO CAPS: 100.0000 mg | ORAL_CAPSULE | Freq: Every day | ORAL | 2 refills | Status: AC

## 2019-12-15 MED ORDER — OMEPRAZOLE 20 MG PO CPDR: 20.0000 mg | DELAYED_RELEASE_CAPSULE | Freq: Every day | ORAL | 1 refills | Status: AC

## 2019-12-15 MED ORDER — DOXYCYCLINE HYCLATE 100 MG PO CAPS: 100.0000 mg | ORAL_CAPSULE | Freq: Two times a day (BID) | ORAL | 0 refills | Status: DC

## 2019-12-15 MED ORDER — ROSUVASTATIN CALCIUM 5 MG PO TABS: 5.0000 mg | ORAL_TABLET | Freq: Every day | ORAL | 1 refills | Status: AC

## 2019-12-15 MED ORDER — CARVEDILOL 3.125 MG PO TABS: 3.1250 mg | ORAL_TABLET | Freq: Two times a day (BID) | ORAL | 1 refills | Status: AC

## 2019-12-15 MED ORDER — PREDNISONE 50 MG PO TABS: 50.0000 mg | ORAL_TABLET | Freq: Every day | ORAL | 0 refills | Status: DC

## 2019-12-15 NOTE — ED Triage Notes (Signed)
Pt c/o chest pain onset around 2:30 am. Pt states she felt like an elephant was sitting on her chest and she couldn't catch her breath. She still feels short of breath, she used her inhaler with no relief. This morning she woke up with headache and took some aleeve.

## 2019-12-15 NOTE — ED Provider Notes (Signed)
[facility] [place] [facility] [place]   Name: Kelly Horton DOB: 01/17/1976 MRN: [id number] CSN: [id number] PCP: [facility]  Arrival date and time:  12/15/19 1427  Chief Complaint:  Hypertension and Chest Pain   NOTE: Prior to seeing the patient today, I have reviewed the triage nursing documentation and vital signs. Clinical staff has updated patient's PMH/PSHx, current medication list, and drug allergies/intolerances to ensure comprehensive history available to assist in medical decision making.   History:   HPI: Kelly Horton is a 44 y.o. female who presents today with complaints of chest pressure and headache.  Patient states her chest pressure started late last night.  She feels as though there is an "elephant on her chest" and has consistent shortness of breath.  She has not used any medications to help with her symptoms.  She denies fever, cough, body aches.  She does have chronic headaches which she treats with naproxen 3 times a day.  Patient also presents with an elevated blood pressure reading.  Upon further questioning, patient states her blood pressure readings have continuously been elevated.  She has multiple prescription medications that she is unable to afford due to financial constraints.  However she states she received a promotion at her job and she is now able to afford her medications.   Past Medical History:  Diagnosis Date   CHF (congestive heart failure) (HCC)    COPD (chronic obstructive pulmonary disease) (HCC)    DJD (degenerative joint disease)    Elevated liver function tests    Essential hypertension    History of echocardiogram    a.  TTE 08/2018: EF 60 to 65%, mildly dilated LV cavity, normal LV diastolic function, no regional wall motion normalities, normal RV systolic function, normal RV cavity size, trileaflet aortic valve with no significant valvular abnormalities, normal size and structure aorta    Hyperlipidemia    Migraine    a.  Complex migraines following stroke in 2003 with strokelike symptoms   Morbid obesity (HCC)    Normal coronary arteries    a.  Patient reported MI in 2018 (details unclear); b.  LHC 08/2018: Normal coronary arteries, normal LV SF, mildly elevated LVEDP 13 mmHg   Stroke [facility]) 2003    Past Surgical History:  Procedure Laterality Date   LEFT HEART CATH AND CORONARY ANGIOGRAPHY N/A 09/06/2018   Procedure: LEFT HEART CATH AND CORONARY ANGIOGRAPHY;  Surgeon: Iran Ouch, MD;  Location: [facility] INVASIVE CV LAB;  Service: Cardiovascular;  Laterality: N/A;    Family History  Problem Relation Age of Onset   Diabetes Mother    Emphysema Mother    Mitral valve prolapse Mother    CAD Mother        a.  Reportedly, 4 MIs with the first being in her 49s and last in her 26s   Breast cancer Mother    Lung cancer Mother    Hypertension Father    Emphysema Father    Prostate cancer Father    CAD Brother        a.  Reportedly, 3 MIs with the first in his 30s, subsequently passing away at age 40 from heart failure   Heart failure Brother    CAD Maternal Grandmother        a.  Four-vessel CABG    Social History   Tobacco Use   Smoking status: Former Smoker    Packs/day: 2.00    Years: 30.00  Pack years: 60.00    Types: Cigarettes    Quit date: 02/01/2018    Years since quitting: 1.8   Smokeless tobacco: Never Used  Substance Use Topics   Alcohol use: No    Alcohol/week: 0.0 standard drinks   Drug use: Not Currently    Types: Marijuana    Patient Active Problem List   Diagnosis Date Noted   History of CVA (cerebrovascular accident) 08/15/2018   Class 3 severe obesity due to excess calories with serious comorbidity and body mass index (BMI) of 40.0 to 44.9 in adult (HCC) 08/15/2018   Heart palpitations 08/15/2018   Chest pain 08/15/2018   SOB (shortness of breath) 08/15/2018   Lower extremity edema 08/15/2018   DDD  (degenerative disc disease), cervical 07/30/2015   Lumbosacral strain 04/03/2015   Procedure and treatment not carried out because of patient's decision for other reasons 03/26/2015   Chronic sprain or strain of lumbosacral region 02/05/2014   Radiculopathy of lumbosacral region 02/05/2014   Stroke (HCC) 01/17/2001    Home Medications:    No outpatient medications have been marked as taking for the 12/15/19 encounter [facility](Hospital Encounter).    Allergies:   Mushroom extract complex, Penicillins, and Shellfish allergy  Review of Systems (ROS): Review of Systems  Constitutional: Negative for chills, fatigue and fever.  HENT: Negative for congestion, sinus pressure, sinus pain and sore throat.   Respiratory: Positive for chest tightness.   Cardiovascular: Positive for chest pain. Negative for palpitations.  Gastrointestinal: Negative for diarrhea, nausea and vomiting.  Musculoskeletal: Negative for myalgias.  Neurological: Positive for headaches.     Vital Signs: Today's Vitals   12/15/19 1441 12/15/19 1442 12/15/19 1523 12/15/19 1607  BP:  (!) 137/103 (!) 134/95   Pulse:  88    Temp:  97.8 F (36.6 C)    TempSrc:  Oral    SpO2:  99%    PainSc: 3    3     Physical Exam: Physical Exam Vitals and nursing note reviewed.  Constitutional:      Appearance: She is well-developed.  HENT:     Right Ear: Tympanic membrane normal.     Left Ear: Tympanic membrane normal.     Mouth/Throat:     Mouth: Mucous membranes are moist.     Pharynx: No posterior oropharyngeal erythema.  Cardiovascular:     Rate and Rhythm: Normal rate and regular rhythm.     Pulses:          Radial pulses are 2+ on the right side and 2+ on the left side.     Heart sounds: Normal heart sounds.  Pulmonary:     Effort: Accessory muscle usage present.     Breath sounds: Normal breath sounds.  Abdominal:     Palpations: Abdomen is soft.  Musculoskeletal:     Cervical back: Normal range of motion.    Skin:    General: Skin is warm and dry.     Capillary Refill: Capillary refill takes less than 2 seconds.  Neurological:     General: No focal deficit present.     Mental Status: She is alert and oriented to person, place, and time.  Psychiatric:        Mood and Affect: Mood normal.        Behavior: Behavior normal.      Urgent Care Treatments / Results:   LABS: PLEASE NOTE: all labs that were ordered this encounter are listed, however only abnormal results are displayed.  Labs Reviewed - No data to display  EKG: Normal sinus rhythm with a ventricular rate of 81 bpm.  RADIOLOGY: DG Chest 2 View  Result Date: 12/15/2019 CLINICAL DATA:  Shortness of breath and chest pressure. EXAM: CHEST - 2 VIEW COMPARISON:  03/02/2019 FINDINGS: Peripheral airspace opacities in both upper lobes are new compared to the prior exam. Mild retrocardiac airspace opacity and suspected mild retro diaphragmatic right basilar opacity. No blunting of the costophrenic angles.  Upper normal heart size. IMPRESSION: 1. New peripheral bilateral upper lobe airspace opacities suspicious for multilobar or atypical pneumonia. If the clinical presentation raises concern for pulmonary embolus then consider chest CT angiogram. Mild airspace opacities at the lung bases. Electronically Signed   By: Gaylyn Rong M.D.   On: 12/15/2019 15:32    PROCEDURES: Procedures  MEDICATIONS RECEIVED THIS VISIT: Medications - No data to display  PERTINENT CLINICAL COURSE NOTES/UPDATES:   Initial Impression / Assessment and Plan / Urgent Care Course:  Pertinent labs & imaging results that were available during my care of the patient were personally reviewed by me and considered in my medical decision making (see lab/imaging section of note for values and interpretations).  Kelly Horton is a 44 y.o. female who presents to [facility] today with complaints of chest pain pressure, diagnosed with hypertension  pneumonia, and treated as such with the medications below.  Patient also received refills on some of her chronic medications.  Patient to follow-up with primary care provider for further evaluation.  NP and patient reviewed discharge instructions below during visit.   Patient is well appearing overall in clinic today. She does not appear to be in any acute distress. Presenting symptoms (see HPI) and exam as documented above.   I have reviewed the follow up and strict return precautions for any new or worsening symptoms. Patient is aware of symptoms that would be deemed urgent/emergent, and would thus require further evaluation either here or in the emergency department. At the time of discharge, she verbalized understanding and consent with the discharge plan as it was reviewed with her. All questions were fielded by provider and/or clinic staff prior to patient discharge.    Final Clinical Impressions / Urgent Care Diagnoses:   Final diagnoses:  Pneumonia of upper lobe due to infectious organism, unspecified laterality  Essential hypertension  Health care maintenance    New Prescriptions:  [place] Controlled Substance Registry consulted? Not Applicable  Meds ordered this encounter  Medications   gabapentin (NEURONTIN) 100 MG capsule    Sig: Take 1 capsule (100 mg total) by mouth at bedtime.    Dispense:  30 capsule    Refill:  2   furosemide (LASIX) 20 MG tablet    Sig: Take 1 tablet (20 mg total) by mouth daily as needed for edema.    Dispense:  30 tablet    Refill:  0   amLODipine (NORVASC) 2.5 MG tablet    Sig: Take 1 tablet (2.5 mg total) by mouth daily.    Dispense:  30 tablet    Refill:  1   carvedilol (COREG) 3.125 MG tablet    Sig: Take 1 tablet (3.125 mg total) by mouth 2 (two) times daily with a meal.    Dispense:  30 tablet    Refill:  1   omeprazole (PRILOSEC) 20 MG capsule    Sig: Take 1 capsule (20 mg total) by mouth daily.    Dispense:  30 capsule    Refill:  1     rosuvastatin (CRESTOR) 5 MG tablet    Sig: Take 1 tablet (5 mg total) by mouth daily.    Dispense:  30 tablet    Refill:  1   doxycycline (VIBRAMYCIN) 100 MG capsule    Sig: Take 1 capsule (100 mg total) by mouth 2 (two) times daily.    Dispense:  20 capsule    Refill:  0   predniSONE (DELTASONE) 50 MG tablet    Sig: Take 1 tablet (50 mg total) by mouth daily with breakfast.    Dispense:  5 tablet    Refill:  0      Discharge Instructions     You were seen for chest pressure and are being treated for pneumonia per your chest x-ray.  Take the antibiotics as prescribed until they're finished. If you think you're having a reaction, stop the medication, take benadryl and go to the nearest urgent care/emergency room. Take a probiotic while taking the antibiotic to decrease the chances of stomach upset.    You are getting started on your blood pressure medication.  Make sure you take it every day and monitor your blood pressure at home.  Your previously prescribed medications were also be ordered.  Make an appointment with your primary care provider to be seen as soon as possible.  Take care, Dr. Sharlet Salina, NP-c     Recommended Follow up Care:  Patient encouraged to follow up with the following provider within the specified time frame, or sooner as dictated by the severity of her symptoms. As always, she was instructed that for any urgent/emergent care needs, she should seek care either here or in the emergency department for more immediate evaluation.   Bailey Mech, DNP, NP-c    Bailey Mech, NP 12/15/19 [phone]

## 2019-12-15 NOTE — Discharge Instructions (Addendum)
You were seen for chest pressure and are being treated for pneumonia per your chest x-ray.  Take the antibiotics as prescribed until they're finished. If you think you're having a reaction, stop the medication, take benadryl and go to the nearest urgent care/emergency room. Take a probiotic while taking the antibiotic to decrease the chances of stomach upset.    You are getting started on your blood pressure medication.  Make sure you take it every day and monitor your blood pressure at home.  Your previously prescribed medications were also be ordered.  Make an appointment with your primary care provider to be seen as soon as possible.  Take care, Dr. Sharlet Salina, NP-c

## 2020-01-06 ENCOUNTER — Ambulatory Visit (INDEPENDENT_AMBULATORY_CARE_PROVIDER_SITE_OTHER): Payer: 59

## 2020-01-06 ENCOUNTER — Encounter: Payer: Self-pay | Admitting: Emergency Medicine

## 2020-01-06 ENCOUNTER — Ambulatory Visit
Admission: EM | Admit: 2020-01-06 | Discharge: 2020-01-06 | Disposition: A | Discharge status: Home / Self Care | Payer: 59 | Attending: Sports Medicine | Admitting: Sports Medicine

## 2020-01-06 ENCOUNTER — Other Ambulatory Visit: Payer: Self-pay

## 2020-01-06 DIAGNOSIS — S99922A Unspecified injury of left foot, initial encounter: Secondary | ICD-10-CM

## 2020-01-06 DIAGNOSIS — S9032XA Contusion of left foot, initial encounter: Secondary | ICD-10-CM

## 2020-01-06 DIAGNOSIS — M79672 Pain in left foot: Secondary | ICD-10-CM

## 2020-01-06 DIAGNOSIS — M25572 Pain in left ankle and joints of left foot: Secondary | ICD-10-CM | POA: Diagnosis not present

## 2020-01-06 NOTE — ED Provider Notes (Signed)
[facility]    CSN: [id number] Arrival date & time: 01/06/20  1712      History   Chief Complaint Chief Complaint  Patient presents with  . Foot Pain    left    HPI Kelly Horton is a 44 y.o. female.   Patient pleasant 44 year old female who presents for evaluation of left foot pain.  She reports about 5 days ago she dropped some shelves directly on her left lower leg and into her left foot.  She still has some pain and her lower leg and points over the anterior tibia near the ankle mortise but it is tolerable.  Most of her pain is over the dorsal aspect of her left foot laterally and into the midfoot.  She has an antalgic gait and has pain with ambulation.  She says it burns but denies any significant numbness or tingling.  She has no chronic problems with this foot or lower extremity.  No red flag signs or symptoms offered by the patient.     Past Medical History:  Diagnosis Date  . CHF (congestive heart failure) (HCC)   . COPD (chronic obstructive pulmonary disease) (HCC)   . DJD (degenerative joint disease)   . Elevated liver function tests   . Essential hypertension   . History of echocardiogram    a.  TTE 08/2018: EF 60 to 65%, mildly dilated LV cavity, normal LV diastolic function, no regional wall motion normalities, normal RV systolic function, normal RV cavity size, trileaflet aortic valve with no significant valvular abnormalities, normal size and structure aorta  . Hyperlipidemia   . Migraine    a.  Complex migraines following stroke in 2003 with strokelike symptoms  . Morbid obesity (HCC)   . Normal coronary arteries    a.  Patient reported MI in 2018 (details unclear); b.  LHC 08/2018: Normal coronary arteries, normal LV SF, mildly elevated LVEDP 13 mmHg  . Stroke [facility]) 2003    Patient Active Problem List   Diagnosis Date Noted  . History of CVA (cerebrovascular accident) 08/15/2018  . Class 3 severe obesity due to excess calories with  serious comorbidity and body mass index (BMI) of 40.0 to 44.9 in adult (HCC) 08/15/2018  . Heart palpitations 08/15/2018  . Chest pain 08/15/2018  . SOB (shortness of breath) 08/15/2018  . Lower extremity edema 08/15/2018  . DDD (degenerative disc disease), cervical 07/30/2015  . Lumbosacral strain 04/03/2015  . Procedure and treatment not carried out because of patient's decision for other reasons 03/26/2015  . Chronic sprain or strain of lumbosacral region 02/05/2014  . Radiculopathy of lumbosacral region 02/05/2014  . Stroke [facility]) 01/17/2001    Past Surgical History:  Procedure Laterality Date  . LEFT HEART CATH AND CORONARY ANGIOGRAPHY N/A 09/06/2018   Procedure: LEFT HEART CATH AND CORONARY ANGIOGRAPHY;  Surgeon: Iran Ouch, MD;  Location: [facility] INVASIVE CV LAB;  Service: Cardiovascular;  Laterality: N/A;    OB History   No obstetric history on file.      Home Medications    Prior to Admission medications   Medication Sig Start Date End Date Taking? Authorizing Provider  acetaminophen (TYLENOL) 500 MG tablet Take 1,000-1,500 mg by mouth 3 (three) times daily as needed for headache.    Yes [provider]  amLODipine (NORVASC) 2.5 MG tablet Take 1 tablet (2.5 mg total) by mouth daily. 12/15/19  Yes Bailey Mech, NP  aspirin EC 81 MG tablet Take 81 mg by mouth daily.  Yes [provider]  carvedilol (COREG) 3.125 MG tablet Take 1 tablet (3.125 mg total) by mouth 2 (two) times daily with a meal. 12/15/19  Yes Bailey MechBenjamin, Lunise, NP  furosemide (LASIX) 20 MG tablet Take 1 tablet (20 mg total) by mouth daily as needed for edema. 12/15/19  Yes Bailey MechBenjamin, Lunise, NP  gabapentin (NEURONTIN) 100 MG capsule Take 1 capsule (100 mg total) by mouth at bedtime. 12/15/19  Yes Bailey MechBenjamin, Lunise, NP  rosuvastatin (CRESTOR) 5 MG tablet Take 1 tablet (5 mg total) by mouth daily. 12/15/19  Yes Bailey MechBenjamin, Lunise, NP  SPIRIVA HANDIHALER 18 MCG inhalation capsule Place 18 mcg  into inhaler and inhale daily as needed (shortness of breath).  06/28/18  Yes [provider]  albuterol (VENTOLIN HFA) 108 (90 Base) MCG/ACT inhaler Inhale 2 puffs into the lungs every 6 (six) hours as needed for wheezing or shortness of breath. 03/02/19   Dionne BucySiadecki, Sebastian, MD  doxycycline (VIBRAMYCIN) 100 MG capsule Take 1 capsule (100 mg total) by mouth 2 (two) times daily. 12/15/19   Bailey MechBenjamin, Lunise, NP  escitalopram (LEXAPRO) 20 MG tablet Take 20 mg by mouth at bedtime.  06/28/18   [provider]  levofloxacin (LEVAQUIN) 500 MG tablet Take 500 mg by mouth daily.    [provider]  omeprazole (PRILOSEC) 20 MG capsule Take 1 capsule (20 mg total) by mouth daily. 12/15/19   Bailey MechBenjamin, Lunise, NP  oxymetazoline (AFRIN) 0.05 % nasal spray Place 1 spray into both nostrils 2 (two) times daily as needed for congestion.    [provider]  predniSONE (DELTASONE) 50 MG tablet Take 1 tablet (50 mg total) by mouth daily with breakfast. 12/15/19   Bailey MechBenjamin, Lunise, NP    Family History Family History  Problem Relation Age of Onset  . Diabetes Mother   . Emphysema Mother   . Mitral valve prolapse Mother   . CAD Mother        a.  Reportedly, 4 MIs with the first being in her 2720s and last in her 7260s  . Breast cancer Mother   . Lung cancer Mother   . Hypertension Father   . Emphysema Father   . Prostate cancer Father   . CAD Brother        a.  Reportedly, 3 MIs with the first in his 30s, subsequently passing away at age 46 from heart failure  . Heart failure Brother   . CAD Maternal Grandmother        a.  Four-vessel CABG    Social History Social History   Tobacco Use  . Smoking status: Former Smoker    Packs/day: 2.00    Years: 30.00    Pack years: 60.00    Types: Cigarettes    Quit date: 02/01/2018    Years since quitting: 1.9  . Smokeless tobacco: Never Used  Substance Use Topics  . Alcohol use: No    Alcohol/week: 0.0 standard drinks  . Drug  use: Not Currently    Types: Marijuana     Allergies   Mushroom extract complex, Penicillins, and Shellfish allergy   Review of Systems Review of Systems  Constitutional: Negative for activity change.  Musculoskeletal: Positive for gait problem.       Positive for left lower extremity pain at the distal tibia and into the left foot  All other systems reviewed and are negative.    Physical Exam Triage Vital Signs ED Triage Vitals  Enc Vitals Group     BP  01/06/20 1754 (!) 148/93     Pulse Rate 01/06/20 1754 90     Resp 01/06/20 1754 18     Temp 01/06/20 1754 98.2 F (36.8 C)     Temp Source 01/06/20 1754 Oral     SpO2 01/06/20 1754 100 %     Weight 01/06/20 1752 257 lb 8 oz (116.8 kg)     Height 01/06/20 1752 5\' 3"  (1.6 m)     Head Circumference --      Peak Flow --      Pain Score 01/06/20 1751 7     Pain Loc --      Pain Edu? --      Excl. in GC? --    No data found.  Updated Vital Signs BP (!) 148/93 (BP Location: Right Arm)   Pulse 90   Temp 98.2 F (36.8 C) (Oral)   Resp 18   Ht 5\' 3"  (1.6 m)   Wt 116.8 kg   SpO2 100%   BMI 45.61 kg/m   Visual Acuity Right Eye Distance:   Left Eye Distance:   Bilateral Distance:    Right Eye Near:   Left Eye Near:    Bilateral Near:     Physical Exam Vitals and nursing note reviewed.  Constitutional:      Appearance: Normal appearance.  Musculoskeletal:     Comments: Right lower extremity within normal limits.  Left lower extremity shows no obvious bony abnormality no signs of erythema.  May be some small amount of soft tissue swelling over the dorsum of the foot.  She is tender to palpation over the anterior aspect of the lower extremity over the tibia and into the soft tissues.  She is also tender over the midfoot and laterally.  There is no instability of the midfoot or forefoot.  Remainder of foot and ankle exam is within normal limits.  Strength is well-preserved.  She has some mild decreased range of  motion secondary to pain and apprehension. Neurovasc: 2+ pulses, normal sensation.  Skin:    General: Skin is warm and dry.     Capillary Refill: Capillary refill takes less than 2 seconds.     Findings: No rash.  Neurological:     General: No focal deficit present.     Mental Status: She is alert and oriented to person, place, and time.      UC Treatments / Results  Labs (all labs ordered are listed, but only abnormal results are displayed) Labs Reviewed - No data to display  EKG   Radiology DG Foot Complete Left  Result Date: 01/06/2020 CLINICAL DATA:  44 year old female with trauma and left foot pain. EXAM: LEFT FOOT - COMPLETE 3+ VIEW COMPARISON:  None. FINDINGS: There is no acute fracture or dislocation. The bones are well mineralized. No arthritic changes. There is diffuse subcutaneous edema. No radiopaque foreign object or soft tissue gas. IMPRESSION: Negative. Electronically Signed   By: 01/08/2020 M.D.   On: 01/06/2020 18:41    Procedures Procedures (including critical care time)  Medications Ordered in UC Medications - No data to display  Initial Impression / Assessment and Plan / UC Course  I have reviewed the triage vital signs and the nursing notes.  Pertinent labs & imaging results that were available during my care of the patient were reviewed by me and considered in my medical decision making (see chart for details).  Clinical impression: Left lower extremity injury secondary to blunt force trauma with  a reassuring x-ray.  Exam is consistent with a contusion.  Treatment plan: 1.  The findings and treatment plan were discussed in detail with the patient.  Patient was in agreement. 2.  Reviewed the x-rays with the patient indicated there was no acute fracture.  Official read as above. 3.  Recommended supportive care.  Activity as tolerated, icing elevation and over-the-counter meds as needed.  Good shoes but try to find those shoes that do not really  flareup her symptoms. 4.  Follow-up here or with orthopedics if symptoms do not improve or worsen.  Otherwise on an as-needed basis.     Final Clinical Impressions(s) / UC Diagnoses   Final diagnoses:  Contusion of left foot, initial encounter  Foot pain, left     Discharge Instructions     Reviewed the x-rays with the patient indicated there was no acute fracture.  Official read as above. Recommended supportive care.  Activity as tolerated, icing elevation and over-the-counter meds as needed.  Good shoes but try to find those shoes that do not really flareup her symptoms. Follow-up here or with orthopedics if symptoms do not improve or worsen.  Otherwise on an as-needed basis.    ED Prescriptions    None     PDMP not reviewed this encounter.   Delton See, MD 01/06/20 [phone]

## 2020-01-06 NOTE — Discharge Instructions (Addendum)
Reviewed the x-rays with the patient indicated there was no acute fracture.  Official read as above. Recommended supportive care.  Activity as tolerated, icing elevation and over-the-counter meds as needed.  Good shoes but try to find those shoes that do not really flareup her symptoms. Follow-up here or with orthopedics if symptoms do not improve or worsen.  Otherwise on an as-needed basis.

## 2020-01-06 NOTE — ED Triage Notes (Signed)
Pt c/o left foot and leg pain. She states she dropped wooden shelves on her foot about 4-5 days ago. She states it is a burning sensation on the top of her foot.

## 2020-10-27 IMAGING — CT CT HEAD W/O CM
3 series · 15 of 45 positions shown, 18 images · non-contrast
Comparison: October 12, 2012

CLINICAL DATA: Possible stroke. Slurred speech and fatigue. Facial
droop.

EXAM:
CT HEAD WITHOUT CONTRAST
TECHNIQUE: Contiguous axial images were obtained from the base of the skull
through the vertex without intravenous contrast.

[Series 3: head wo · axial · 0.47mm/px · z∈[-150,-35]mm · 9 of 28 slices shown, 12 images]
[im 3/28  brain]
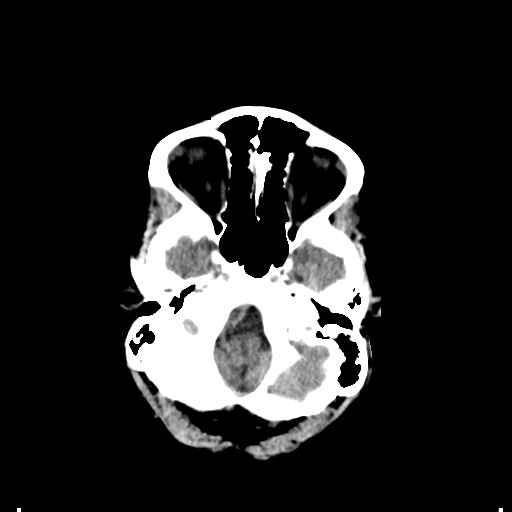
[im 3/28  bone]
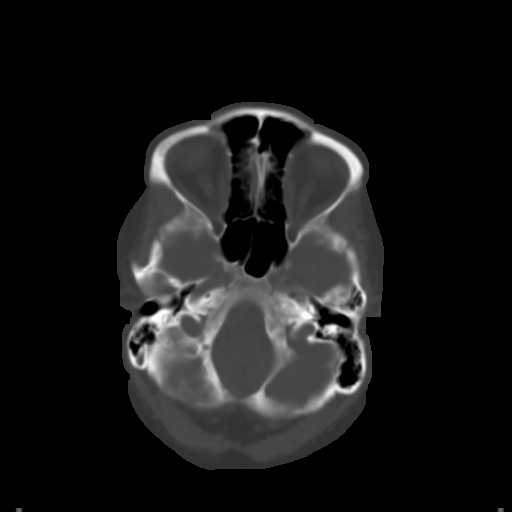
[im 6/28  brain]
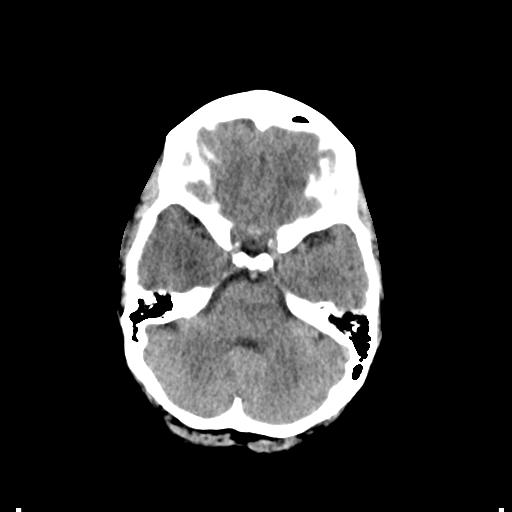
[im 9/28  brain]
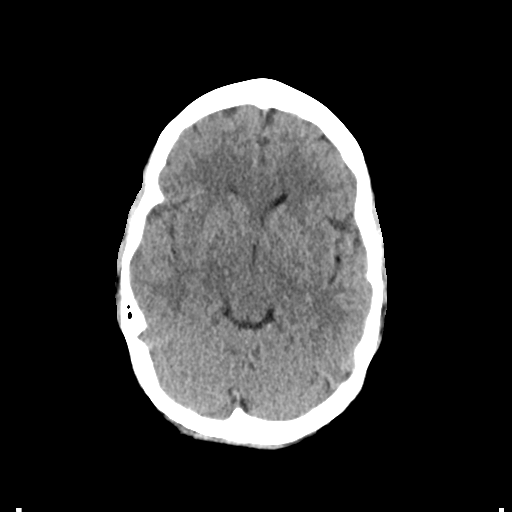
[im 12/28  brain]
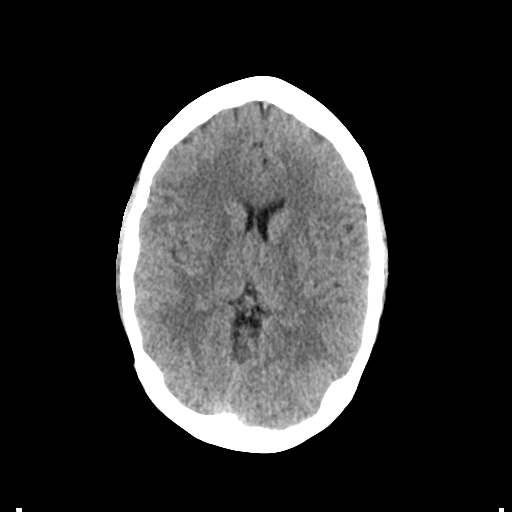
[im 15/28  brain]
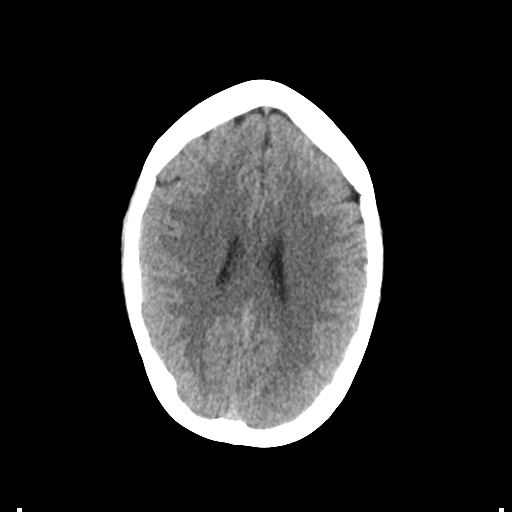
[im 15/28  bone]
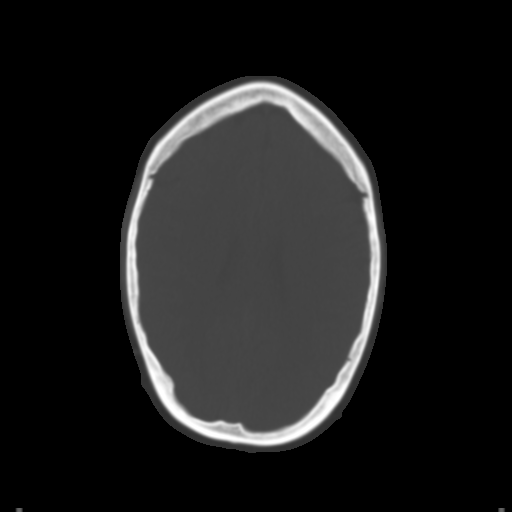
[im 17/28  brain]
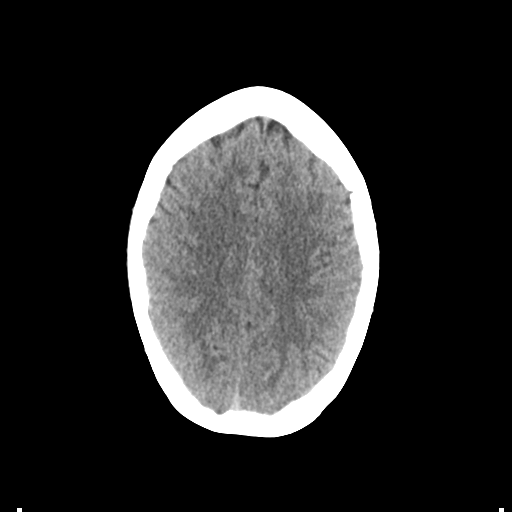
[im 20/28  brain]
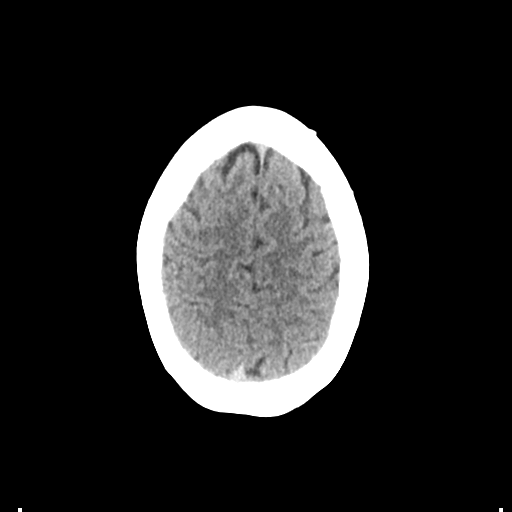
[im 23/28  brain]
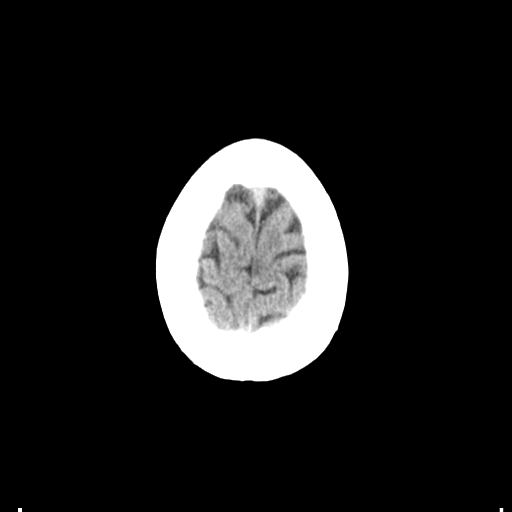
[im 26/28  brain]
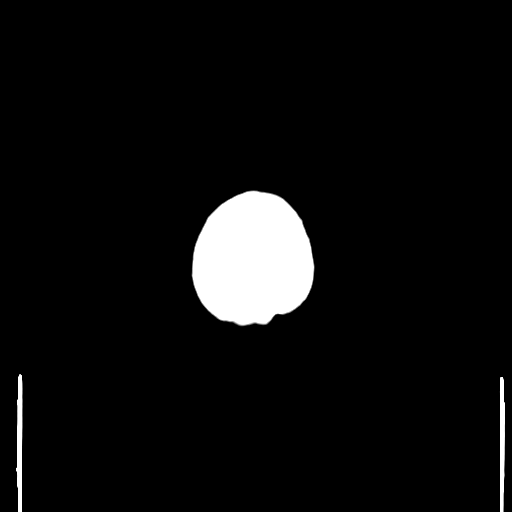
[im 26/28  bone]
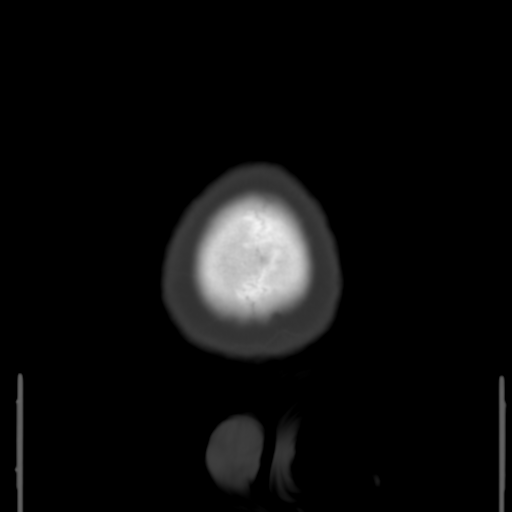

[Series 4: coronal soft tissue · coronal · 0.27mm/px · 3 of 65 slices shown]
[im 22/65  brain]
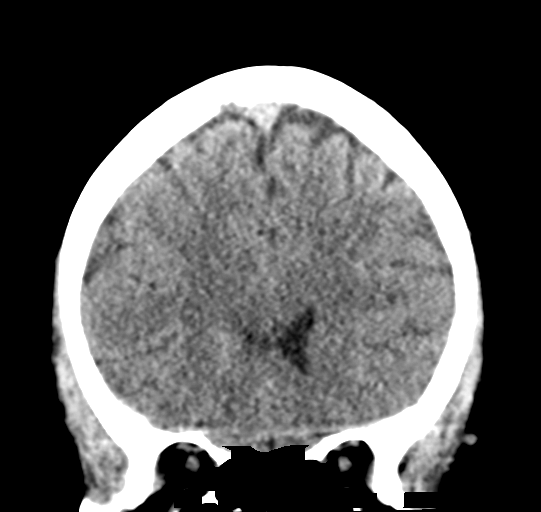
[im 29/65  brain]
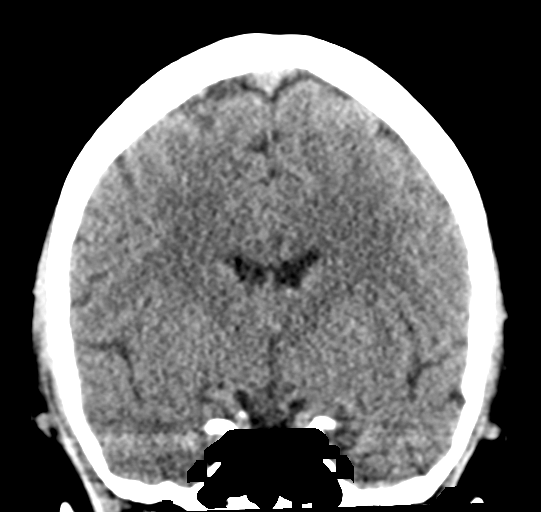
[im 36/65  brain]
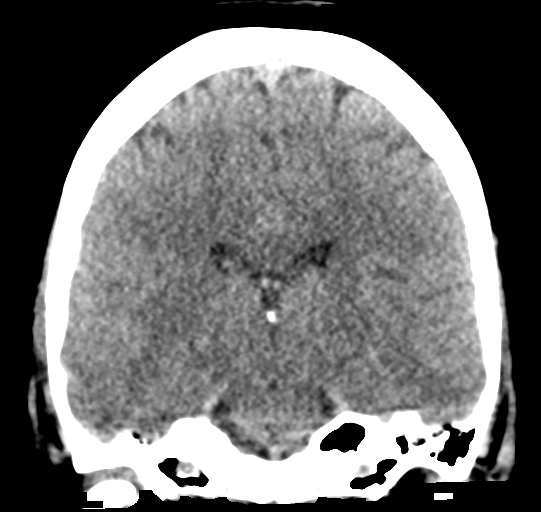

[Series 5: sagittal soft tissue · sagittal · 0.27mm/px · 3 of 50 slices shown]
[im 17/50  brain]
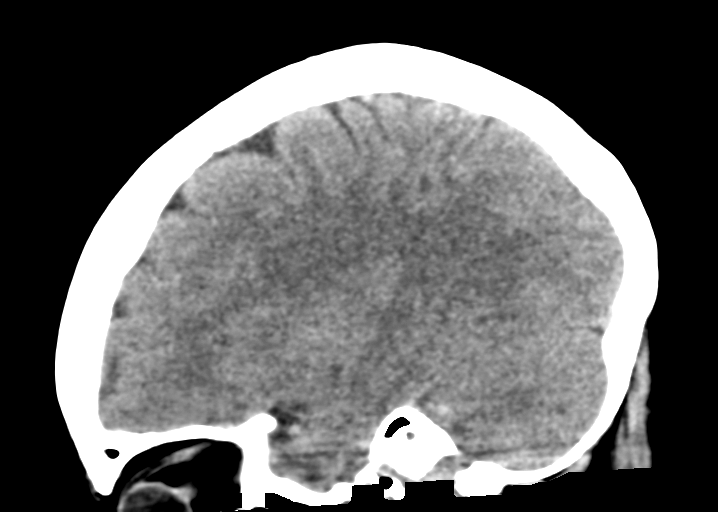
[im 25/50  brain]
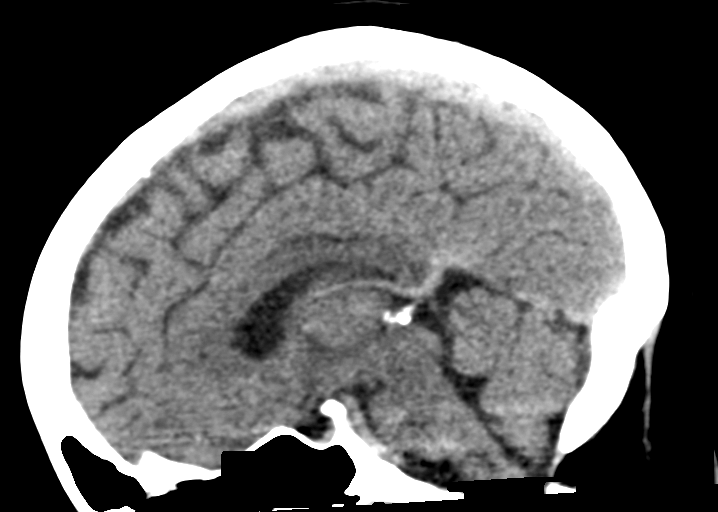
[im 33/50  brain]
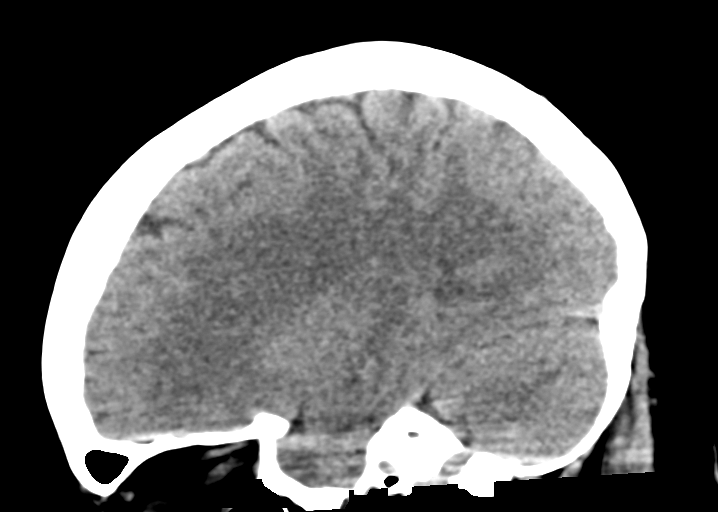

[15 of 45 positions shown; findings below may reference images not displayed]

FINDINGS: Brain: No evidence of acute infarction, hemorrhage, hydrocephalus,
extra-axial collection or mass lesion/mass effect.

Vascular: No hyperdense vessel or unexpected calcification.

Skull: Normal. Negative for fracture or focal lesion.

Sinuses/Orbits: No acute finding.

Other: None.
IMPRESSION: No acute intracranial abnormalities identified.  Normal study.

## 2020-12-01 ENCOUNTER — Other Ambulatory Visit: Payer: Self-pay

## 2020-12-01 ENCOUNTER — Ambulatory Visit
Admission: EM | Admit: 2020-12-01 | Discharge: 2020-12-01 | Disposition: A | Discharge status: Home / Self Care | Payer: 59 | Attending: Internal Medicine | Admitting: Internal Medicine

## 2020-12-01 DIAGNOSIS — U071 COVID-19: Secondary | ICD-10-CM | POA: Diagnosis not present

## 2020-12-01 DIAGNOSIS — R0981 Nasal congestion: Secondary | ICD-10-CM

## 2020-12-01 DIAGNOSIS — R051 Acute cough: Secondary | ICD-10-CM

## 2020-12-01 MED ORDER — BENZONATATE 200 MG PO CAPS: 200.0000 mg | ORAL_CAPSULE | Freq: Three times a day (TID) | ORAL | 1 refills | Status: AC | PRN

## 2020-12-01 MED ORDER — PREDNISONE 50 MG PO TABS: 50.0000 mg | ORAL_TABLET | Freq: Every day | ORAL | 0 refills | Status: AC

## 2020-12-01 MED ORDER — ALBUTEROL SULFATE HFA 108 (90 BASE) MCG/ACT IN AERS
2.0000 | INHALATION_SPRAY | Freq: Once | RESPIRATORY_TRACT | Status: AC
  Administered 2020-12-01: 2 via RESPIRATORY_TRACT

## 2020-12-01 NOTE — ED Triage Notes (Signed)
Pt here with C/O chest congestion, coughing up bloody phlegm, short of breath. Took Covid test at home Thursday night and was positive. Had double pneumonia last year.

## 2020-12-01 NOTE — Discharge Instructions (Addendum)
Symptoms today seem most likely related to Covid-19 infection.  Vital signs including temperature, heart rate, and oxygen saturation were normal today and there were no findings today to suggest pneumonia.  Anticipate gradual improvement in head congestion, cough, diarrhea, and well-being over the next several days.  Prescriptions for prednisone (for congestion) and benzonatate (for cough) were sent to the pharmacy.  Albuterol inhaler-- since the albuterol inhaler was helpful in the urgent care, can use it at home to relieve cough, chest congestion, 2 puffs 4 times daily as needed (inhaler was given to patient in the urgent care).  Keep pushing fluids.  Rest.  Note to return to work 12/04/2020.  Recheck for new fever >100.5, increasing phlegm production/nasal discharge, or if not starting to improve as expected over the next several days.

## 2020-12-01 NOTE — ED Provider Notes (Signed)
[facility]    CSN: [id number] Arrival date & time: 12/01/20  0817      History   Chief Complaint Chief Complaint  Patient presents with   Cough   Covid Positive    HPI Kelly Horton is a 45 y.o. female.  She presents today with about a 1 week history of fatigue, breathlessness, cough productive of scant, occasionally bloody, phlegm.  Central chest feels tight, and she finds that she has less coughing if she sleeps propped up.  Head is very congested, without much runny nose.  Throat is sore.  She is not vomiting, but had nausea for a few days last week.  She has been having watery diarrhea several times daily for the last couple days.  Appetite is poor, she is not eating, but she is pushing fluids.  She does not report dizziness.  She has had fever up to 103 previously, but not recently.  Missing work--works in a Conservator, museum/gallery, customer facing.  HPI  Past Medical History:  Diagnosis Date   CHF (congestive heart failure) (HCC)    COPD (chronic obstructive pulmonary disease) (HCC)    DJD (degenerative joint disease)    Elevated liver function tests    Essential hypertension    History of echocardiogram    a.  TTE 08/2018: EF 60 to 65%, mildly dilated LV cavity, normal LV diastolic function, no regional wall motion normalities, normal RV systolic function, normal RV cavity size, trileaflet aortic valve with no significant valvular abnormalities, normal size and structure aorta   Hyperlipidemia    Migraine    a.  Complex migraines following stroke in 2003 with strokelike symptoms   Morbid obesity (HCC)    Normal coronary arteries    a.  Patient reported MI in 2018 (details unclear); b.  LHC 08/2018: Normal coronary arteries, normal LV SF, mildly elevated LVEDP 13 mmHg   Stroke [facility]) 2003    Patient Active Problem List   Diagnosis Date Noted   History of CVA (cerebrovascular accident) 08/15/2018   Class 3 severe obesity due to excess calories with  serious comorbidity and body mass index (BMI) of 40.0 to 44.9 in adult [facility]) 08/15/2018   Heart palpitations 08/15/2018   Chest pain 08/15/2018   SOB (shortness of breath) 08/15/2018   Lower extremity edema 08/15/2018   DDD (degenerative disc disease), cervical 07/30/2015   Lumbosacral strain 04/03/2015   Procedure and treatment not carried out because of patient's decision for other reasons 03/26/2015   Chronic sprain or strain of lumbosacral region 02/05/2014   Radiculopathy of lumbosacral region 02/05/2014   Stroke (HCC) 01/17/2001    Past Surgical History:  Procedure Laterality Date   LEFT HEART CATH AND CORONARY ANGIOGRAPHY N/A 09/06/2018   Procedure: LEFT HEART CATH AND CORONARY ANGIOGRAPHY;  Surgeon: Iran Ouch, MD;  Location: [facility] INVASIVE CV LAB;  Service: Cardiovascular;  Laterality: N/A;      Home Medications    Prior to Admission medications   Medication Sig Start Date End Date Taking? Authorizing Provider  benzonatate (TESSALON) 200 MG capsule Take 1 capsule (200 mg total) by mouth 3 (three) times daily as needed for cough. 12/01/20  Yes Isa Rankin, MD  escitalopram (LEXAPRO) 20 MG tablet Take 20 mg by mouth at bedtime.  06/28/18  Yes [provider]  furosemide (LASIX) 20 MG tablet Take 1 tablet (20 mg total) by mouth daily as needed for edema. 12/15/19  Yes Bailey Mech, NP  gabapentin (NEURONTIN)  100 MG capsule Take 1 capsule (100 mg total) by mouth at bedtime. 12/15/19  Yes Bailey Mech, NP  omeprazole (PRILOSEC) 20 MG capsule Take 1 capsule (20 mg total) by mouth daily. 12/15/19  Yes Bailey Mech, NP  predniSONE (DELTASONE) 50 MG tablet Take 1 tablet (50 mg total) by mouth daily. 12/01/20  Yes Isa Rankin, MD  amLODipine (NORVASC) 2.5 MG tablet Take 1 tablet (2.5 mg total) by mouth daily. 12/15/19   Bailey Mech, NP  aspirin EC 81 MG tablet Take 81 mg by mouth daily.    [provider]  carvedilol (COREG)  3.125 MG tablet Take 1 tablet (3.125 mg total) by mouth 2 (two) times daily with a meal. 12/15/19   Bailey Mech, NP  rosuvastatin (CRESTOR) 5 MG tablet Take 1 tablet (5 mg total) by mouth daily. 12/15/19   Bailey Mech, NP    Family History Family History  Problem Relation Age of Onset   Diabetes Mother    Emphysema Mother    Mitral valve prolapse Mother    CAD Mother        a.  Reportedly, 4 MIs with the first being in her 6s and last in her 43s   Breast cancer Mother    Lung cancer Mother    Hypertension Father    Emphysema Father    Prostate cancer Father    CAD Brother        a.  Reportedly, 3 MIs with the first in his 30s, subsequently passing away at age 29 from heart failure   Heart failure Brother    CAD Maternal Grandmother        a.  Four-vessel CABG    Social History Social History   Tobacco Use   Smoking status: Former    Packs/day: 2.00    Years: 30.00    Pack years: 60.00    Types: Cigarettes    Quit date: 02/01/2018    Years since quitting: 2.8   Smokeless tobacco: Never  Substance Use Topics   Alcohol use: No    Alcohol/week: 0.0 standard drinks   Drug use: Not Currently    Types: Marijuana     Allergies   Mushroom extract complex, Penicillins, and Shellfish allergy   Review of Systems Review of Systems see HPI   Physical Exam Triage Vital Signs ED Triage Vitals  Enc Vitals Group     BP 12/01/20 0838 119/78     Pulse Rate 12/01/20 0838 85     Resp 12/01/20 0838 20     Temp 12/01/20 0838 97.8 F (36.6 C)     Temp Source 12/01/20 0838 Oral     SpO2 12/01/20 0838 100 %     Weight 12/01/20 0835 250 lb (113.4 kg)     Height 12/01/20 0835 5\' 2"  (1.575 m)     Pain Score 12/01/20 0835 0     Pain Loc --      Updated Vital Signs BP 119/78 (BP Location: Left Arm)   Pulse 85   Temp 97.8 F (36.6 C) (Oral)   Resp 20   Ht 5\' 2"  (1.575 m)   Wt 113.4 kg   SpO2 100%   BMI 45.73 kg/m   Orthostatic VS for the past 24 hrs (Last 3  readings):  BP- Lying Pulse- Lying BP- Sitting Pulse- Sitting BP- Standing at 0 minutes Pulse- Standing at 0 minutes BP- Standing at 3 minutes Pulse- Standing at 3 minutes  12/01/20 0915 119/89 81 124/86  89 (!) 133/112 96 117/81 101     Physical Exam Constitutional:      General: She is not in acute distress.    Appearance: She is ill-appearing. She is not toxic-appearing.  HENT:     Head: Atraumatic.     Comments: Bilateral TMs are dull, and the right is flushed.  Moderate nasal congestion.  Throat is injected throughout without obvious postnasal drainage.    Mouth/Throat:     Comments: Voice sounds very congested Eyes:     Conjunctiva/sclera:     Right eye: Right conjunctiva is not injected. No exudate.    Left eye: Left conjunctiva is not injected. No exudate. Cardiovascular:     Rate and Rhythm: Normal rate and regular rhythm.  Pulmonary:     Effort: Pulmonary effort is normal.     Breath sounds: No decreased air movement. No wheezing or rhonchi.     Comments: Unlabored Abdominal:     General: There is no distension.  Musculoskeletal:     Cervical back: Neck supple.  Skin:    General: Skin is warm and dry.     Coloration: Skin is not cyanotic.     Comments: Pink, no edema  Neurological:     Mental Status: She is alert.     Comments: Able to walk into the exam room independently, climb on/off the exam table without assistance  Psychiatric:     Comments: Speech is clear/coherent, logical     UC Treatments / Results  Labs (all labs ordered are listed, but only abnormal results are displayed) Labs Reviewed - No data to display No labs indicated at urgent care today   Radiology No results found. No radiology indicated at urgent care today   Medications Ordered in UC Medications  albuterol (VENTOLIN HFA) 108 (90 Base) MCG/ACT inhaler 2 puff (2 puffs Inhalation Given 12/01/20 0914)  Albuterol was helpful in improving chest tightness, urge to cough.  Initial  Impression / Assessment and Plan / UC Course  I have reviewed the triage vital signs and the nursing notes.  Pertinent labs & imaging results that were available during my care of the patient were reviewed by me and considered in my medical decision making (see chart for details).   Orthostatic vitals were equivocal for orthostasis.  Encouraged patient to push fluids.   Final Clinical Impressions(s) / UC Diagnoses   Final diagnoses:  COVID-19  Head congestion  Acute cough     Discharge Instructions      Symptoms today seem most likely related to Covid-19 infection.  Vital signs including temperature, heart rate, and oxygen saturation were normal today and there were no findings today to suggest pneumonia.  Anticipate gradual improvement in head congestion, cough, diarrhea, and well-being over the next several days.  Prescriptions for prednisone (for congestion) and benzonatate (for cough) were sent to the pharmacy.  Albuterol inhaler-- If the albuterol inhaler was helpful in the urgent care, can use at home to relieve cough, chest congestion, 2 puffs 4 times daily as needed.  Keep pushing fluids.  Rest.  Note to return to work 12/04/2020.  Recheck for new fever >100.5, increasing phlegm production/nasal discharge, or if not starting to improve as expected over the next several days.        ED Prescriptions     Medication Sig Dispense Auth. Provider   benzonatate (TESSALON) 200 MG capsule Take 1 capsule (200 mg total) by mouth 3 (three) times daily as needed for cough. 30 capsule  Isa Rankin, MD   predniSONE (DELTASONE) 50 MG tablet Take 1 tablet (50 mg total) by mouth daily. 3 tablet Isa Rankin, MD      PDMP not reviewed this encounter.   Isa Rankin, MD 12/02/20 [phone]

## 2022-05-05 ENCOUNTER — Ambulatory Visit
Admission: RE | Admit: 2022-05-05 | Discharge: 2022-05-05 | Disposition: A | Discharge status: Home / Self Care | Payer: BC Managed Care – PPO | Source: Ambulatory Visit | Attending: Family Medicine | Admitting: Family Medicine

## 2022-05-05 ENCOUNTER — Other Ambulatory Visit: Payer: Self-pay | Admitting: Family Medicine

## 2022-05-05 DIAGNOSIS — R0781 Pleurodynia: Secondary | ICD-10-CM

## 2022-05-05 DIAGNOSIS — M545 Low back pain, unspecified: Secondary | ICD-10-CM | POA: Insufficient documentation
